# Patient Record
Sex: Female | Born: 1995 | Hispanic: Yes | Marital: Single | Smoking: Never smoker
Health system: Southern US, Community
[De-identification: ages and names within clinical notes are randomized; demographics above are authoritative.]

## PROBLEM LIST (undated history)

## (undated) DIAGNOSIS — E119 Type 2 diabetes mellitus without complications: Secondary | ICD-10-CM

## (undated) DIAGNOSIS — I2699 Other pulmonary embolism without acute cor pulmonale: Secondary | ICD-10-CM

## (undated) HISTORY — PX: APPENDECTOMY: SHX54

---
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
## 2017-01-29 ENCOUNTER — Encounter: Payer: Self-pay | Admitting: Emergency Medicine

## 2017-01-29 DIAGNOSIS — Z8249 Family history of ischemic heart disease and other diseases of the circulatory system: Secondary | ICD-10-CM | POA: Insufficient documentation

## 2017-01-29 DIAGNOSIS — R000 Tachycardia, unspecified: Secondary | ICD-10-CM | POA: Insufficient documentation

## 2017-01-29 DIAGNOSIS — Z713 Dietary counseling and surveillance: Secondary | ICD-10-CM | POA: Insufficient documentation

## 2017-01-29 DIAGNOSIS — I2699 Other pulmonary embolism without acute cor pulmonale: Principal | ICD-10-CM | POA: Insufficient documentation

## 2017-01-29 DIAGNOSIS — R0789 Other chest pain: Secondary | ICD-10-CM | POA: Insufficient documentation

## 2017-01-29 DIAGNOSIS — Z6841 Body Mass Index (BMI) 40.0 and over, adult: Secondary | ICD-10-CM | POA: Insufficient documentation

## 2017-01-29 DIAGNOSIS — E1165 Type 2 diabetes mellitus with hyperglycemia: Secondary | ICD-10-CM | POA: Insufficient documentation

## 2017-01-29 LAB — GLUCOSE, CAPILLARY: GLUCOSE-CAPILLARY: 463 mg/dL — AB (ref 65–99)

## 2017-01-29 NOTE — ED Triage Notes (Signed)
Pt presents to ED c/o mid chest pain since Saturday with increasing pain during inspiration. Pt denies hx of the same. Pt denies dizziness, abdominal pain, or shortness of breath. Pt alert and oriented, skin warm and dry.

## 2017-01-30 ENCOUNTER — Emergency Department: Payer: Self-pay

## 2017-01-30 ENCOUNTER — Observation Stay
Admission: EM | Admit: 2017-01-30 | Discharge: 2017-01-31 | Disposition: A | Discharge status: Home / Self Care | Payer: Self-pay | Attending: Internal Medicine | Admitting: Internal Medicine

## 2017-01-30 DIAGNOSIS — Z7901 Long term (current) use of anticoagulants: Secondary | ICD-10-CM

## 2017-01-30 DIAGNOSIS — I2699 Other pulmonary embolism without acute cor pulmonale: Secondary | ICD-10-CM

## 2017-01-30 DIAGNOSIS — R0789 Other chest pain: Secondary | ICD-10-CM | POA: Diagnosis present

## 2017-01-30 DIAGNOSIS — E119 Type 2 diabetes mellitus without complications: Secondary | ICD-10-CM

## 2017-01-30 DIAGNOSIS — I82403 Acute embolism and thrombosis of unspecified deep veins of lower extremity, bilateral: Secondary | ICD-10-CM

## 2017-01-30 DIAGNOSIS — R079 Chest pain, unspecified: Secondary | ICD-10-CM

## 2017-01-30 DIAGNOSIS — R739 Hyperglycemia, unspecified: Secondary | ICD-10-CM

## 2017-01-30 HISTORY — DX: Type 2 diabetes mellitus without complications: E11.9

## 2017-01-30 LAB — POCT PREGNANCY, URINE: PREG TEST UR: NEGATIVE

## 2017-01-30 LAB — BASIC METABOLIC PANEL
Anion gap: 10 (ref 5–15)
BUN: 6 mg/dL (ref 6–20)
CALCIUM: 9.4 mg/dL (ref 8.9–10.3)
CHLORIDE: 97 mmol/L — AB (ref 101–111)
CO2: 27 mmol/L (ref 22–32)
CREATININE: 0.51 mg/dL (ref 0.44–1.00)
GFR calc Af Amer: >60 mL/min (ref >60)
GFR calc non Af Amer: >60 mL/min (ref >60)
Glucose, Bld: 469 mg/dL — ABNORMAL HIGH (ref 65–99)
Potassium: 4.2 mmol/L (ref 3.5–5.1)
SODIUM: 134 mmol/L — AB (ref 135–145)

## 2017-01-30 LAB — CBC
HCT: 37.3 % (ref 35.0–47.0)
Hemoglobin: 12.2 g/dL (ref 12.0–16.0)
MCH: 24.9 pg — ABNORMAL LOW (ref 26.0–34.0)
MCHC: 32.8 g/dL (ref 32.0–36.0)
MCV: 75.8 fL — AB (ref 80.0–100.0)
PLATELETS: 298 10*3/uL (ref 150–440)
RBC: 4.92 MIL/uL (ref 3.80–5.20)
RDW: 13.8 % (ref 11.5–14.5)
WBC: 10.4 10*3/uL (ref 3.6–11.0)

## 2017-01-30 LAB — TSH: TSH: 3.054 u[IU]/mL (ref 0.350–4.500)

## 2017-01-30 LAB — GLUCOSE, CAPILLARY
GLUCOSE-CAPILLARY: 334 mg/dL — AB (ref 65–99)
Glucose-Capillary: 267 mg/dL — ABNORMAL HIGH (ref 65–99)
Glucose-Capillary: 300 mg/dL — ABNORMAL HIGH (ref 65–99)
Glucose-Capillary: 264 mg/dL — ABNORMAL HIGH (ref 65–99)
Glucose-Capillary: 210 mg/dL — ABNORMAL HIGH (ref 65–99)

## 2017-01-30 LAB — TROPONIN I

## 2017-01-30 LAB — FIBRIN DERIVATIVES D-DIMER (ARMC ONLY): Fibrin derivatives D-dimer (ARMC): 1260.76 — ABNORMAL HIGH (ref 0.00–499.00)

## 2017-01-30 MED ORDER — INSULIN GLARGINE 100 UNIT/ML ~~LOC~~ SOLN
25.0000 [IU] | Freq: Every day | SUBCUTANEOUS | Status: DC
  Administered 2017-01-30 – 2017-01-31 (×2): 25 [IU] via SUBCUTANEOUS
  Filled 2017-01-30 (×2): qty 0.25

## 2017-01-30 MED ORDER — ONDANSETRON HCL 4 MG PO TABS: 4.0000 mg | ORAL_TABLET | Freq: Four times a day (QID) | ORAL | Status: DC | PRN

## 2017-01-30 MED ORDER — INSULIN ASPART 100 UNIT/ML ~~LOC~~ SOLN
0.0000 [IU] | Freq: Every day | SUBCUTANEOUS | Status: DC
  Administered 2017-01-30: 2 [IU] via SUBCUTANEOUS
  Filled 2017-01-30: qty 2

## 2017-01-30 MED ORDER — ACETAMINOPHEN 650 MG RE SUPP: 650.0000 mg | Freq: Four times a day (QID) | RECTAL | Status: DC | PRN

## 2017-01-30 MED ORDER — ACETAMINOPHEN 325 MG PO TABS
650.0000 mg | ORAL_TABLET | Freq: Four times a day (QID) | ORAL | Status: DC | PRN
  Administered 2017-01-30: 650 mg via ORAL
  Filled 2017-01-30: qty 2

## 2017-01-30 MED ORDER — DOCUSATE SODIUM 100 MG PO CAPS
100.0000 mg | ORAL_CAPSULE | Freq: Two times a day (BID) | ORAL | Status: DC
  Administered 2017-01-30 – 2017-01-31 (×2): 100 mg via ORAL
  Filled 2017-01-30 (×2): qty 1

## 2017-01-30 MED ORDER — SODIUM CHLORIDE 0.9 % IV BOLUS (SEPSIS)
1000.0000 mL | Freq: Once | INTRAVENOUS | Status: AC
  Administered 2017-01-30: 1000 mL via INTRAVENOUS

## 2017-01-30 MED ORDER — IOPAMIDOL (ISOVUE-370) INJECTION 76%
75.0000 mL | Freq: Once | INTRAVENOUS | Status: AC | PRN
  Administered 2017-01-30: 75 mL via INTRAVENOUS

## 2017-01-30 MED ORDER — ENOXAPARIN SODIUM 150 MG/ML ~~LOC~~ SOLN
1.0000 mg/kg | Freq: Two times a day (BID) | SUBCUTANEOUS | Status: DC
  Administered 2017-01-30 – 2017-01-31 (×3): 125 mg via SUBCUTANEOUS
  Filled 2017-01-30 (×4): qty 0.82

## 2017-01-30 MED ORDER — ONDANSETRON HCL 4 MG/2ML IJ SOLN: 4.0000 mg | Freq: Four times a day (QID) | INTRAMUSCULAR | Status: DC | PRN

## 2017-01-30 MED ORDER — INSULIN ASPART 100 UNIT/ML ~~LOC~~ SOLN
0.0000 [IU] | Freq: Three times a day (TID) | SUBCUTANEOUS | Status: DC
  Administered 2017-01-30 – 2017-01-31 (×3): 8 [IU] via SUBCUTANEOUS
  Administered 2017-01-31: 5 [IU] via SUBCUTANEOUS
  Filled 2017-01-30 (×3): qty 8
  Filled 2017-01-30: qty 5

## 2017-01-30 MED ORDER — MORPHINE SULFATE (PF) 2 MG/ML IV SOLN: 2.0000 mg | INTRAVENOUS | Status: DC | PRN

## 2017-01-30 MED ORDER — INSULIN GLARGINE 100 UNIT/ML ~~LOC~~ SOLN
25.0000 [IU] | Freq: Every day | SUBCUTANEOUS | Status: DC
  Filled 2017-01-30: qty 0.25

## 2017-01-30 MED ORDER — LIVING WELL WITH DIABETES BOOK
Freq: Once | Status: AC
  Administered 2017-01-30: 12:00:00
  Filled 2017-01-30: qty 1

## 2017-01-30 MED ORDER — INSULIN STARTER KIT- SYRINGES (ENGLISH)
1.0000 | Freq: Once | Status: AC
  Administered 2017-01-30: 1
  Filled 2017-01-30 (×2): qty 1

## 2017-01-30 NOTE — Progress Notes (Signed)
ANTICOAGULATION CONSULT NOTE - Initial Consult  Pharmacy Consult for Lovenox treatment dose Indication: pulmonary embolus  No Known Allergies  Patient Measurements: Height: 5\' 5"  (165.1 cm) Weight: 270 lb (122.5 kg) IBW/kg (Calculated) : 57 Heparin Dosing Weight:    Vital Signs: Temp: 98.5 F (36.9 C) (06/05 0838) Temp Source: Oral (06/05 0838) BP: 139/75 (06/05 0838) Pulse Rate: 97 (06/05 0838)  Labs:  Recent Labs  01/29/17 2350  HGB 12.2  HCT 37.3  PLT 298  CREATININE 0.51  TROPONINI <0.03    Estimated Creatinine Clearance: 146.1 mL/min (by C-G formula based on SCr of 0.51 mg/dL).   Medical History: Past Medical History:  Diagnosis Date  . Diabetes mellitus without complication (HCC)     Medications:  Scheduled:  . docusate sodium  100 mg Oral BID  . enoxaparin (LOVENOX) injection  1 mg/kg Subcutaneous Q12H  . insulin aspart  0-15 Units Subcutaneous TID WC  . insulin aspart  0-5 Units Subcutaneous QHS  . insulin glargine  25 Units Subcutaneous QHS    Assessment: 21 yo with Chest pain, significantly elevated D-dimer. No definitive PE however a pulmonary infarction is present.  Goal of Therapy:  Monitor platelets by anticoagulation protocol: Yes  Monitor Serum Creatine per protocol   Plan:  Continue current order for Full dose Lovenox 1 mg/kg (125mg ) subcutaneously q12h.   Caedmon Louque A 01/30/2017,9:55 AM

## 2017-01-30 NOTE — H&P (Signed)
Kathleen Green is an 21 y.o. female.   Chief Complaint: Chest pain HPI: The patient with no prior past medical history presents to the emergency department complaining of right sided chest pain. The patient states that her chest hurts when she takes a deep breath or when she twists to the right. She states her pain began possibly 2 days ago in her right shoulder and migrated to her back and finally into her right rib cage today. She denies fevers but admits to chills. Laboratory evaluation significant for elevated d-dimer which prompted CTA. No definitive pulmonary embolism demonstrated however a pulmonary infarction is present indicating the presence of the former as well. Also, the patient's glucoses significantly elevated indicating new-onset diabetes which brought to the emergency department staff to call the hospitalist service for admission.  Past Medical History:  Diagnosis Date  . Diabetes mellitus without complication [facility])     Past Surgical History:  Procedure Laterality Date  . APPENDECTOMY      Family History  Problem Relation Age of Onset  . Diabetes Mellitus II Paternal Grandmother   . Diabetes Mellitus II Paternal Grandfather    Social History:  reports that she has never smoked. She has never used smokeless tobacco. She reports that she does not drink alcohol or use drugs.  Allergies: No Known Allergies  Prior to Admission medications   Not on File     Results for orders placed or performed during the hospital encounter of 01/30/17 (from the past 48 hour(s))  Basic metabolic panel     Status: Abnormal   Collection Time: 01/29/17 11:50 PM  Result Value Ref Range   Sodium 134 (L) 135 - 145 mmol/L   Potassium 4.2 3.5 - 5.1 mmol/L   Chloride 97 (L) 101 - 111 mmol/L   CO2 27 22 - 32 mmol/L   Glucose, Bld 469 (H) 65 - 99 mg/dL   BUN 6 6 - 20 mg/dL   Creatinine, Ser 0.51 0.44 - 1.00 mg/dL   Calcium 9.4 8.9 - 10.3 mg/dL   GFR calc non Af Amer >60 >60 mL/min    GFR calc Af Amer >60 >60 mL/min    Comment: (NOTE) The eGFR has been calculated using the CKD EPI equation. This calculation has not been validated in all clinical situations. eGFR's persistently <60 mL/min signify possible Chronic Kidney Disease.    Anion gap 10 5 - 15  CBC     Status: Abnormal   Collection Time: 01/29/17 11:50 PM  Result Value Ref Range   WBC 10.4 3.6 - 11.0 K/uL   RBC 4.92 3.80 - 5.20 MIL/uL   Hemoglobin 12.2 12.0 - 16.0 g/dL   HCT 37.3 35.0 - 47.0 %   MCV 75.8 (L) 80.0 - 100.0 fL   MCH 24.9 (L) 26.0 - 34.0 pg   MCHC 32.8 32.0 - 36.0 g/dL   RDW 13.8 11.5 - 14.5 %   Platelets 298 150 - 440 K/uL  Troponin I     Status: None   Collection Time: 01/29/17 11:50 PM  Result Value Ref Range   Troponin I <0.03 <0.03 ng/mL  Glucose, capillary     Status: Abnormal   Collection Time: 01/29/17 11:52 PM  Result Value Ref Range   Glucose-Capillary 463 (H) 65 - 99 mg/dL  Fibrin derivatives D-Dimer ([facility] only)     Status: Abnormal   Collection Time: 01/30/17  2:35 AM  Result Value Ref Range   Fibrin derivatives D-dimer ([facility]) 1,260.76 (H) 0.00 -  499.00    Comment: (NOTE) <> Exclusion of Venous Thromboembolism (VTE) - OUTPATIENT ONLY   (Emergency Department or [place])   0-499 ng/ml (FEU): With a low to intermediate pretest probability                      for VTE this test result excludes the diagnosis                      of VTE.   >499 ng/ml (FEU) : VTE not excluded; additional work up for VTE is                      required. <> Testing on Inpatients and Evaluation of Disseminated Intravascular   Coagulation ([facility]) Reference Range:   0-499 ng/ml (FEU)   Glucose, capillary     Status: Abnormal   Collection Time: 01/30/17  4:26 AM  Result Value Ref Range   Glucose-Capillary 334 (H) 65 - 99 mg/dL  Pregnancy, urine POC     Status: None   Collection Time: 01/30/17  4:27 AM  Result Value Ref Range   Preg Test, Ur NEGATIVE NEGATIVE    Comment:        THE SENSITIVITY OF  THIS METHODOLOGY IS >24 mIU/mL    Dg Chest 2 View  Result Date: 01/30/2017 CLINICAL DATA:  Mid chest pain for 2 days. EXAM: CHEST  2 VIEW COMPARISON:  03/10/2010 FINDINGS: Patchy opacity at the right costophrenic angle likely localizes to the right middle lobe on the lateral view. Overall lung volumes are low. Normal heart size and mediastinal contours. No pulmonary edema. No pleural fluid or pneumothorax. No acute osseous abnormalities. IMPRESSION: Patchy right lung base opacity likely localizing to the middle lobe on the lateral view. This may be atelectasis or pneumonia in the appropriate clinical setting. Electronically Signed   By: Jeb Levering M.D.   On: 01/30/2017 00:31   Ct Angio Chest Pe W Or Wo Contrast  Result Date: 01/30/2017 CLINICAL DATA:  Chest pain. EXAM: CT ANGIOGRAPHY CHEST WITH CONTRAST TECHNIQUE: Multidetector CT imaging of the chest was performed using the standard protocol during bolus administration of intravenous contrast. Multiplanar CT image reconstructions and MIPs were obtained to evaluate the vascular anatomy. CONTRAST:  75 cc Isovue 370 IV COMPARISON:  Chest radiograph earlier this day. FINDINGS: Cardiovascular: There are no filling defects in the central pulmonary arteries to suggest pulmonary embolus, however the subsegmental branches are not well opacified due to soft tissue attenuation from habitus. Thoracic aorta is normal in caliber. The heart is normal in size. No evidence of right heart strain. Mediastinum/Nodes: Prominent right hilar node measures 11 mm. No mediastinal or left hilar adenopathy. No axillary adenopathy. The esoph There are no acute or suspicious osseous abnormalities. agus is decompressed. Visualized thyroid gland is normal. Lungs/Pleura: Peripheral wedge-shaped consolidation in the right lower lobe with surrounding ground-glass opacity. Left lung is clear. No pleural fluid. Upper Abdomen: Liver appears prominent size, only partially included.  Musculoskeletal: There are no acute or suspicious osseous abnormalities. Review of the MIP images confirms the above findings. IMPRESSION: Peripheral wedge-shaped consolidation in the right lower lobe is highly suspicious for pulmonary infarct. There is no central pulmonary arterial filling defect, however distal branches are not well opacified suspect right lower lobe pulmonary embolus is not well visualized on the current exam. Overall findings are highly suggestive of acute pulmonary embolus. These results were called by telephone at the time  of interpretation on 01/30/2017 at 5:28 am to Dr. Charlesetta Ivory , who verbally acknowledged these results. Electronically Signed   By: Jeb Levering M.D.   On: 01/30/2017 05:29    Review of Systems  Constitutional: Negative for chills and fever.  HENT: Negative for sore throat and tinnitus.   Eyes: Negative for blurred vision and redness.  Respiratory: Negative for cough and shortness of breath.   Cardiovascular: Positive for chest pain. Negative for palpitations, orthopnea and PND.  Gastrointestinal: Negative for abdominal pain, diarrhea, nausea and vomiting.  Genitourinary: Negative for dysuria, frequency and urgency.  Musculoskeletal: Negative for joint pain and myalgias.  Skin: Negative for rash.       No lesions  Neurological: Negative for speech change, focal weakness and weakness.  Endo/Heme/Allergies: Does not bruise/bleed easily.       No temperature intolerance  Psychiatric/Behavioral: Negative for depression and suicidal ideas.    Blood pressure (!) 134/57, pulse 90, temperature 99.4 F (37.4 C), temperature source Oral, resp. rate 20, height 5' 5"  (1.651 m), weight 122.5 kg (270 lb), last menstrual period 01/15/2017, SpO2 97 %. Physical Exam  Vitals reviewed. Constitutional: She is oriented to person, place, and time. She appears well-developed and well-nourished. No distress.  HENT:  Head: Normocephalic and atraumatic.   Mouth/Throat: Oropharynx is clear and moist.  Eyes: Conjunctivae and EOM are normal. Pupils are equal, round, and reactive to light. No scleral icterus.  Neck: Normal range of motion. Neck supple. No tracheal deviation present. No thyromegaly present.  Cardiovascular: Normal rate, regular rhythm and normal heart sounds.  Exam reveals no gallop and no friction rub.   No murmur heard. Respiratory: Effort normal and breath sounds normal.  GI: Soft. Bowel sounds are normal. She exhibits no distension. There is no tenderness.  Genitourinary:  Genitourinary Comments: Deferred  Musculoskeletal: Normal range of motion. She exhibits no edema.  Lymphadenopathy:    She has no cervical adenopathy.  Neurological: She is alert and oriented to person, place, and time. No cranial nerve deficit. She exhibits normal muscle tone.  Skin: Skin is warm and dry. No rash noted. No erythema.  Psychiatric: She has a normal mood and affect. Her behavior is normal. Judgment and thought content normal.     Assessment/Plan This is a 21 year old female admitted for pulmonary and infarction. 1. Pulmonary embolism: With infarction; no hypoxia. Started on therapeutic Lovenox. We will arrange for anticoagulation therapy the patient can afford. No right heart strain demonstrated on CT scan. 2. Diabetes mellitus type 2: New onset; start patient on sliding scale insulin. Start weight-based basal insulin as well. 3. Morbid obesity: BMI 45; encourage healthy diet and exercise 4. DVT prophylaxis: Full dose anticoagulation 5. GI prophylaxis: None The patient is a full code. Time spent on admission was inpatient care approximately 45 minutes  Harrie Foreman, MD 01/30/2017, 6:09 AM

## 2017-01-30 NOTE — ED Provider Notes (Signed)
[facility] Emergency Department Provider Note   ____________________________________________   First MD Initiated Contact with Patient 01/30/17 0230     (approximate)  I have reviewed the triage vital signs and the nursing notes.   HISTORY  Chief Complaint Chest Pain    HPI Kathleen Green is a 21 y.o. female who comes into the hospital today with some pain in her right chest. She reports that she woke up Saturday morning with pain in her right shoulder and in the middle of her chest when she took a deep breath. She reports that it moved to her back on Sunday and then today the entire right side of her chest hurts whenever she takes a deep breath. The patient denies any plane trips or recent car trips. She reports that she felt a little warm last night but didn't check her temperature. She denies any cough dizziness or lightheadedness but she has had some shortness of breath. The patient rates her pain a 78 out of 10 in intensity but reports this worse when she takes a deep breath. She took some Tylenol for pain but it didn't help. The patient is here today for evaluation of these symptoms.   Past Medical History:  Diagnosis Date  . Diabetes mellitus without complication [facility])     Patient Active Problem List   Diagnosis Date Noted  . Atypical chest pain 01/30/2017    Past Surgical History:  Procedure Laterality Date  . APPENDECTOMY      Prior to Admission medications   Not on File    Allergies Patient has no known allergies.  Family History  Problem Relation Age of Onset  . Diabetes Mellitus II Paternal Grandmother   . Diabetes Mellitus II Paternal Grandfather     Social History Social History  Substance Use Topics  . Smoking status: Never Smoker  . Smokeless tobacco: Never Used  . Alcohol use No    Review of Systems  Constitutional: No fever/chills Eyes: No visual changes. ENT: No sore throat. Cardiovascular:  chest  pain. Respiratory: shortness of breath. Gastrointestinal: No abdominal pain.  No nausea, no vomiting.  No diarrhea.  No constipation. Genitourinary: Negative for dysuria. Musculoskeletal: Negative for back pain. Skin: Negative for rash. Neurological: Negative for headaches, focal weakness or numbness.   ____________________________________________   PHYSICAL EXAM:  VITAL SIGNS: ED Triage Vitals  Enc Vitals Group     BP 01/29/17 2354 (!) 153/99     Pulse Rate 01/29/17 2354 (!) 118     Resp 01/29/17 2354 20     Temp 01/29/17 2354 99.4 F (37.4 C)     Temp Source 01/29/17 2354 Oral     SpO2 01/29/17 2354 98 %     Weight 01/29/17 2349 270 lb (122.5 kg)     Height 01/29/17 2349 5\' 5"  (1.651 m)     Head Circumference --      Peak Flow --      Pain Score 01/29/17 2349 8     Pain Loc --      Pain Edu? --      Excl. in GC? --     Constitutional: Alert and oriented. Well appearing and in Moderate distress. Eyes: Conjunctivae are normal. PERRL. EOMI. Head: Atraumatic. Nose: No congestion/rhinnorhea. Mouth/Throat: Mucous membranes are moist.  Oropharynx non-erythematous. Cardiovascular: Normal rate, regular rhythm. Grossly normal heart sounds.  Good peripheral circulation. Respiratory: Normal respiratory effort.  No retractions. Lungs CTAB. Gastrointestinal: Soft and nontender. No distention. Positive  bowel sounds Musculoskeletal: No lower extremity tenderness nor edema.   Neurologic:  Normal speech and language.  Skin:  Skin is warm, dry and intact.  Psychiatric: Mood and affect are normal.   ____________________________________________   LABS (all labs ordered are listed, but only abnormal results are displayed)  Labs Reviewed  BASIC METABOLIC PANEL - Abnormal; Notable for the following:       Result Value   Sodium 134 (*)    Chloride 97 (*)    Glucose, Bld 469 (*)    All other components within normal limits  CBC - Abnormal; Notable for the following:    MCV 75.8  (*)    MCH 24.9 (*)    All other components within normal limits  GLUCOSE, CAPILLARY - Abnormal; Notable for the following:    Glucose-Capillary 463 (*)    All other components within normal limits  FIBRIN DERIVATIVES D-DIMER ([facility] ONLY) - Abnormal; Notable for the following:    Fibrin derivatives D-dimer ([facility]) 1,260.76 (*)    All other components within normal limits  GLUCOSE, CAPILLARY - Abnormal; Notable for the following:    Glucose-Capillary 334 (*)    All other components within normal limits  TROPONIN I  POC URINE PREG, ED  POCT PREGNANCY, URINE   ____________________________________________  EKG  ED ECG REPORT I, Rebecka Apley, the attending physician, personally viewed and interpreted this ECG.   Date: 01/29/2017  EKG Time: 2346  Rate: 118  Rhythm: sinus tachycardia  Axis: normal  Intervals:none  ST&T Change: none  ____________________________________________  RADIOLOGY  Dg Chest 2 View  Result Date: 01/30/2017 CLINICAL DATA:  Mid chest pain for 2 days. EXAM: CHEST  2 VIEW COMPARISON:  03/10/2010 FINDINGS: Patchy opacity at the right costophrenic angle likely localizes to the right middle lobe on the lateral view. Overall lung volumes are low. Normal heart size and mediastinal contours. No pulmonary edema. No pleural fluid or pneumothorax. No acute osseous abnormalities. IMPRESSION: Patchy right lung base opacity likely localizing to the middle lobe on the lateral view. This may be atelectasis or pneumonia in the appropriate clinical setting. Electronically Signed   By: Rubye Oaks M.D.   On: 01/30/2017 00:31   Ct Angio Chest Pe W Or Wo Contrast  Result Date: 01/30/2017 CLINICAL DATA:  Chest pain. EXAM: CT ANGIOGRAPHY CHEST WITH CONTRAST TECHNIQUE: Multidetector CT imaging of the chest was performed using the standard protocol during bolus administration of intravenous contrast. Multiplanar CT image reconstructions and MIPs were obtained to evaluate the  vascular anatomy. CONTRAST:  75 cc Isovue 370 IV COMPARISON:  Chest radiograph earlier this day. FINDINGS: Cardiovascular: There are no filling defects in the central pulmonary arteries to suggest pulmonary embolus, however the subsegmental branches are not well opacified due to soft tissue attenuation from habitus. Thoracic aorta is normal in caliber. The heart is normal in size. No evidence of right heart strain. Mediastinum/Nodes: Prominent right hilar node measures 11 mm. No mediastinal or left hilar adenopathy. No axillary adenopathy. The esoph There are no acute or suspicious osseous abnormalities. agus is decompressed. Visualized thyroid gland is normal. Lungs/Pleura: Peripheral wedge-shaped consolidation in the right lower lobe with surrounding ground-glass opacity. Left lung is clear. No pleural fluid. Upper Abdomen: Liver appears prominent size, only partially included. Musculoskeletal: There are no acute or suspicious osseous abnormalities. Review of the MIP images confirms the above findings. IMPRESSION: Peripheral wedge-shaped consolidation in the right lower lobe is highly suspicious for pulmonary infarct. There is no central  pulmonary arterial filling defect, however distal branches are not well opacified suspect right lower lobe pulmonary embolus is not well visualized on the current exam. Overall findings are highly suggestive of acute pulmonary embolus. These results were called by telephone at the time of interpretation on 01/30/2017 at 5:28 am to Dr. Lucrezia EuropeALLISON Brennden Masten , who verbally acknowledged these results. Electronically Signed   By: Rubye OaksMelanie  Ehinger M.D.   On: 01/30/2017 05:29    ____________________________________________   PROCEDURES  Procedure(s) performed: None  Procedures  Critical Care performed: No  ____________________________________________   INITIAL IMPRESSION / ASSESSMENT AND PLAN / ED COURSE  Pertinent labs & imaging results that were available during my care of  the patient were reviewed by me and considered in my medical decision making (see chart for details).  This is a 21 year old female who comes into the hospital today with some chest pain. The patient's pain was pleuritic in nature so I did check a d-dimer. The patient's d-dimer was elevated so I sent her for a CT scan of her chest. The CT scan shows a pulmonary infarct unlikely pulmonary embolus. I did give the patient 2 L of normal saline but once I found that she did have the pulmonary infarct I decided to admit the patient to the hospitalist service. She did have some mild persistent tachycardia and may not be able to follow up easily. I will give the patient a dose of Lovenox and she'll be reassessed.  Clinical Course as of Jan 30 614  Tue Jan 30, 2017  0151 Patchy right lung base opacity likely localizing to the middle lobe on the lateral view. This may be atelectasis or pneumonia in the appropriate clinical setting.   DG Chest 2 View [AW]    Clinical Course User Index [AW] Rebecka ApleyWebster, Jaxsin Bottomley P, MD     ____________________________________________   FINAL CLINICAL IMPRESSION(S) / ED DIAGNOSES  Final diagnoses:  Chest pain  Pulmonary infarct [facility](HCC)  Other acute pulmonary embolism without acute cor pulmonale (HCC)  Hyperglycemia      NEW MEDICATIONS STARTED DURING THIS VISIT:  New Prescriptions   No medications on file     Note:  This document was prepared using Dragon voice recognition software and may include unintentional dictation errors.    Rebecka ApleyWebster, Alveena Taira P, MD 01/30/17 [phone]

## 2017-01-30 NOTE — Care Management Note (Signed)
Case Management Note  Patient Details  Name: Kasandra Knudsenndrea Yanez Gutierrez MRN: [id number] Date of Birth: Nov 08, 1995  Subjective/Objective:                 Admitted with chest pain and ruled in for pulmonary embolus.  Also diagnosed with new onset diabetes.  Has not see a doctor in 5-6 years.  Employed without insurance.  Action/Plan:   Provide glucose monitor.  Applications for [facility] given.  Discussed anticoagulation regimen and need for close follow up either at [facility] or [facility].   Expected Discharge Date:                  Expected Discharge Plan:     In-House Referral:     Discharge planning Services     Post Acute Care Choice:    Choice offered to:     DME Arranged:    DME Agency:     HH Arranged:    HH Agency:     Status of Service:     If discussed at [employer] Meetings, dates discussed:    Additional Comments:  Eber HongGreene, Luther Newhouse R, RN 01/30/2017, 11:46 AM

## 2017-01-30 NOTE — Consult Note (Signed)
[facility]  Date of admission:  01/30/2017  Inpatient day:  01/30/2017  Consulting physician: Dr. Demetrios Loll   Reason for Consultation:  PE  Chief Complaint: Kathleen Green is a 21 y.o. female with no significant past medical history who was admitted through the emergency room with a pulmonary embolism.  HPI:  The patient denies any past medical problems.  She has not been to the [facility] in several years.  She was in her usual state of good health until 01/27/2017 when she developed right shoulder pain which then radiated into her anterior chest. She described pleuritic chest pain. She denied any lower extremity edema. She noted a low grade temperature (99) with a brief chill.  She presented to the emergency room. D dimers were elevated. Chest x-ray revealed patchy right lung base opacity likely localizing to the middle lobe. Chest CT angiogram revealed peripheral wedge shaped consolidation in the right lower lobe highly suspicious for pulmonary infarct. There was no central pulmonary artery filling defect, however distal branches were not well opacified with suspect right lower lobe pulmonary embolism. Findings were highly suggestive of acute pulmonary embolism.  The patient was started on therapeutic Lovenox.  The patient notes a family history of thrombosis in her paternal grandfather. She states that his "blood clots easily". She notes that he had a CVA in his 45s.   She denies any history of tobacco use or birth control.  She denies any immobility or dehydration. She states that she goes to the gym 1-2 times a week and works out with legs curls, treadmill and bicycle.   Past Medical History:  Diagnosis Date  . Diabetes mellitus without complication [facility])     Past Surgical History:  Procedure Laterality Date  . APPENDECTOMY      Family History  Problem Relation Age of Onset  . Diabetes Mellitus II Paternal Grandmother   . Diabetes  Mellitus II Paternal Grandfather   . Clotting disorder Maternal Grandfather     Social History:  reports that she has never smoked. She has never used smokeless tobacco. She reports that she does not drink alcohol or use drugs. She has a full-time job at the [employer]. She goes to [employer] and is working on a degree in early childhood development. She graduates next year. She lives her grandparents and siblings.  Allergies: No Known Allergies  No prescriptions prior to admission.    Review of Systems: GENERAL:  Feels "ok".  Low grade temperature and brief chill prior to admission.  No weight loss. PERFORMANCE STATUS (ECOG):  1 HEENT:  No visual changes, runny nose, sore throat, mouth sores or tenderness. Lungs: No shortness of breath or cough.  No hemoptysis. Cardiac:  Pleuritic chest pain.  No palpitations, orthopnea, or PND. GI:  No nausea, vomiting, diarrhea, constipation, melena or hematochezia. GU:  Chronic urinary frequency.  No urgency, dysuria, or hematuria. Musculoskeletal:  No back pain.  No joint pain.  No muscle tenderness. Extremities:  No pain or swelling. Skin:  No rashes or skin changes. Neuro:  Headaches.  No numbness or weakness, balance or coordination issues. Endocrine:  No diabetes, thyroid issues, hot flashes or night sweats. Psych:  No mood changes, depression or anxiety. Pain:  No focal pain. Review of systems:  All other systems reviewed and found to be negative.  Physical Exam:  Blood pressure (!) 144/92, pulse 100, temperature 98.5 F (36.9 C), temperature source Oral, resp. rate 16, height 5'  5" (1.651 m), weight 270 lb (122.5 kg), last menstrual period 01/15/2017, SpO2 99 %.  GENERAL:  Well developed, well nourished, heavyset young woman sitting comfortably on the medical unit in no acute distress. MENTAL STATUS:  Alert and oriented to person, place and time. HEAD:  Long brown hair.  Normocephalic, atraumatic, face symmetric,  no Cushingoid features. EYES:  Brown eyes.  Pupils equal round and reactive to light and accomodation.  No conjunctivitis or scleral icterus. ENT:  Oropharynx clear without lesion.  Tongue normal. Mucous membranes moist.  RESPIRATORY:  Clear to auscultation without rales, wheezes or rhonchi. CARDIOVASCULAR:  Regular rate and rhythm without murmur, rub or gallop. ABDOMEN:  Soft, non-tender, with active bowel sounds, and no appreciable hepatosplenomegaly.  No masses. SKIN:  No rashes, ulcers or lesions. EXTREMITIES: No edema, no skin discoloration or tenderness.  No palpable cords. LYMPH NODES: No palpable cervical, supraclavicular, axillary or inguinal adenopathy  NEUROLOGICAL: Unremarkable. PSYCH:  Appropriate.   Results for orders placed or performed during the hospital encounter of 01/30/17 (from the past 48 hour(s))  Basic metabolic panel     Status: Abnormal   Collection Time: 01/29/17 11:50 PM  Result Value Ref Range   Sodium 134 (L) 135 - 145 mmol/L   Potassium 4.2 3.5 - 5.1 mmol/L   Chloride 97 (L) 101 - 111 mmol/L   CO2 27 22 - 32 mmol/L   Glucose, Bld 469 (H) 65 - 99 mg/dL   BUN 6 6 - 20 mg/dL   Creatinine, Ser 0.51 0.44 - 1.00 mg/dL   Calcium 9.4 8.9 - 10.3 mg/dL   GFR calc non Af Amer >60 >60 mL/min   GFR calc Af Amer >60 >60 mL/min    Comment: (NOTE) The eGFR has been calculated using the CKD EPI equation. This calculation has not been validated in all clinical situations. eGFR's persistently <60 mL/min signify possible Chronic Kidney Disease.    Anion gap 10 5 - 15  CBC     Status: Abnormal   Collection Time: 01/29/17 11:50 PM  Result Value Ref Range   WBC 10.4 3.6 - 11.0 K/uL   RBC 4.92 3.80 - 5.20 MIL/uL   Hemoglobin 12.2 12.0 - 16.0 g/dL   HCT 37.3 35.0 - 47.0 %   MCV 75.8 (L) 80.0 - 100.0 fL   MCH 24.9 (L) 26.0 - 34.0 pg   MCHC 32.8 32.0 - 36.0 g/dL   RDW 13.8 11.5 - 14.5 %   Platelets 298 150 - 440 K/uL  Troponin I     Status: None   Collection Time:  01/29/17 11:50 PM  Result Value Ref Range   Troponin I <0.03 <0.03 ng/mL  TSH     Status: None   Collection Time: 01/29/17 11:50 PM  Result Value Ref Range   TSH 3.054 0.350 - 4.500 uIU/mL    Comment: Performed by a 3rd Generation assay with a functional sensitivity of <=0.01 uIU/mL.  Glucose, capillary     Status: Abnormal   Collection Time: 01/29/17 11:52 PM  Result Value Ref Range   Glucose-Capillary 463 (H) 65 - 99 mg/dL  Fibrin derivatives D-Dimer ([facility] only)     Status: Abnormal   Collection Time: 01/30/17  2:35 AM  Result Value Ref Range   Fibrin derivatives D-dimer ([facility]) 1,260.76 (H) 0.00 - 499.00    Comment: (NOTE) <> Exclusion of Venous Thromboembolism (VTE) - OUTPATIENT ONLY   (Emergency Department or [place])   0-499 ng/ml (FEU): With a low to intermediate  pretest probability                      for VTE this test result excludes the diagnosis                      of VTE.   >499 ng/ml (FEU) : VTE not excluded; additional work up for VTE is                      required. <> Testing on Inpatients and Evaluation of Disseminated Intravascular   Coagulation (DIC) Reference Range:   0-499 ng/ml (FEU)   Glucose, capillary     Status: Abnormal   Collection Time: 01/30/17  4:26 AM  Result Value Ref Range   Glucose-Capillary 334 (H) 65 - 99 mg/dL  Pregnancy, urine POC     Status: None   Collection Time: 01/30/17  4:27 AM  Result Value Ref Range   Preg Test, Ur NEGATIVE NEGATIVE    Comment:        THE SENSITIVITY OF THIS METHODOLOGY IS >24 mIU/mL   Glucose, capillary     Status: Abnormal   Collection Time: 01/30/17  8:41 AM  Result Value Ref Range   Glucose-Capillary 267 (H) 65 - 99 mg/dL  Glucose, capillary     Status: Abnormal   Collection Time: 01/30/17 12:09 PM  Result Value Ref Range   Glucose-Capillary 300 (H) 65 - 99 mg/dL  Glucose, capillary     Status: Abnormal   Collection Time: 01/30/17  4:38 PM  Result Value Ref Range   Glucose-Capillary 264 (H) 65 - 99  mg/dL   Dg Chest 2 View  Result Date: 01/30/2017 CLINICAL DATA:  Mid chest pain for 2 days. EXAM: CHEST  2 VIEW COMPARISON:  03/10/2010 FINDINGS: Patchy opacity at the right costophrenic angle likely localizes to the right middle lobe on the lateral view. Overall lung volumes are low. Normal heart size and mediastinal contours. No pulmonary edema. No pleural fluid or pneumothorax. No acute osseous abnormalities. IMPRESSION: Patchy right lung base opacity likely localizing to the middle lobe on the lateral view. This may be atelectasis or pneumonia in the appropriate clinical setting. Electronically Signed   By: Jeb Levering M.D.   On: 01/30/2017 00:31   Ct Angio Chest Pe W Or Wo Contrast  Result Date: 01/30/2017 CLINICAL DATA:  Chest pain. EXAM: CT ANGIOGRAPHY CHEST WITH CONTRAST TECHNIQUE: Multidetector CT imaging of the chest was performed using the standard protocol during bolus administration of intravenous contrast. Multiplanar CT image reconstructions and MIPs were obtained to evaluate the vascular anatomy. CONTRAST:  75 cc Isovue 370 IV COMPARISON:  Chest radiograph earlier this day. FINDINGS: Cardiovascular: There are no filling defects in the central pulmonary arteries to suggest pulmonary embolus, however the subsegmental branches are not well opacified due to soft tissue attenuation from habitus. Thoracic aorta is normal in caliber. The heart is normal in size. No evidence of right heart strain. Mediastinum/Nodes: Prominent right hilar node measures 11 mm. No mediastinal or left hilar adenopathy. No axillary adenopathy. The esoph There are no acute or suspicious osseous abnormalities. agus is decompressed. Visualized thyroid gland is normal. Lungs/Pleura: Peripheral wedge-shaped consolidation in the right lower lobe with surrounding ground-glass opacity. Left lung is clear. No pleural fluid. Upper Abdomen: Liver appears prominent size, only partially included. Musculoskeletal: There are no acute  or suspicious osseous abnormalities. Review of the MIP images confirms the above findings. IMPRESSION:  Peripheral wedge-shaped consolidation in the right lower lobe is highly suspicious for pulmonary infarct. There is no central pulmonary arterial filling defect, however distal branches are not well opacified suspect right lower lobe pulmonary embolus is not well visualized on the current exam. Overall findings are highly suggestive of acute pulmonary embolus. These results were called by telephone at the time of interpretation on 01/30/2017 at 5:28 am to Dr. Charlesetta Ivory , who verbally acknowledged these results. Electronically Signed   By: Jeb Levering M.D.   On: 01/30/2017 05:29    Assessment:  The patient is a 21 y.o.  woman with right lower lobe pulmonary embolism without apparent risk factors.  She is over weight.  She denies any tobacco use, birth control, immobility or dehydration.  She is currently on Lovenox.   Plan:   1.  Discuss risk factors for thrombosis and planned treatment.  Discuss at minimum 6 months of anticoagulation based on work-up. 2.  Lab:  LFTs, Factor V Leiden, prothrombin gene mutation, lupus anticoagulant panel, anticardiolipin antibodies, beta-2 glycoprotein antibodies. 3.  Plan to check protein C, protein S, and AT III activity and antigen in 3 months (after acute clot). 4.  Bilateral lower extremity duplex. 5.  Continue Lovenox with plan to convert to long term anticoagulant. 6.  Anticipate follow-up in the hematology clinic after discharge.   Thank you for allowing me to participate in Kathleen Green 's care.  I will follow her closely with you while hospitalized and after discharge in the outpatient department.   Lequita Asal, MD  01/30/2017, 8:06 PM

## 2017-01-30 NOTE — Progress Notes (Signed)
A & O. Up  In the room. Room air. NSR. Pt reports no pain. Pt tolerating diet well. FS are stable. Pt given the diabetes education book. Pt has no further concerns at this time.

## 2017-01-30 NOTE — Progress Notes (Signed)
Spoke with patient over the phone (Diabetes Coordinator not on [place] today) about new diabetes diagnosis. Patient states that she speaks [place] and [place] and she is able to communicate very well in [place] and states that she prefers any written literature to be in [place].  Patient reports that she has a history of Prediabetes and she use to take Metformin and monitor glucose in the past. Patient reports that she has not taken Metformin or checked her glucose in over 4 years. Patient states that her glucose was usually in the low 100's mg/dl when she last checked her glucose several years ago.  Explained what an A1C is and informed patient that her A1C results are still in process at this time.  Discussed basic pathophysiology of DM Type 2, basic home care, importance of checking CBGs and maintaining good CBG control to prevent long-term and short-term complications. Reviewed glucose and A1C goals.  Discussed impact of nutrition, exercise, stress, sickness, and medications on diabetes control. Informed patient that the Living Well with diabetes booklet would be ordered and encouraged patient to read through entire book. Inquired about willingness to take insulin as an outpatient and patient states that she would prefer to use oral DM medications as an outpatient and make lifestyle modifications with diet and exercise then follow up with a doctor. Informed patient, MD will likely wait until A1C results are processed in order to decide if she will need insulin as an outpatient. Informed patient that she would be ordered an insulin starter kit (vial/syringe) and bedside nursing will be educating her on insulin in case she needs to be discharged on insulin. Patient is agreeable to be educated on insulin but states she would prefer to use oral DM medications as an outpatient.  Patient currently does not have any insurance and will need to establish care with PCP for follow up. Patient reports that she went to  the [facility] in the past but it has been about 4 years since she was last seen there. Placed consult for CM for assistance with follow up and with medication assistance. Patient reports that she does not currently follow a carb modified diet. Briefly discussed carb modified diet and informed patient a consult for RD would be ordered for more education on diet.  Informed patient that RNs will be asking her to self-administer insulin to ensure proper technique and ability to administer self insulin shots in case she needs insulin as an outpatient.   Patient verbalized understanding of information discussed and she states that she has no further questions at this time related to diabetes.   RNs to provide ongoing basic DM education at bedside with this patient and engage patient to actively check blood glucose and administer insulin injections.   MD-at time of discharge patient will need Rx for: glucometer, testing supplies, and DM medications  Thanks, Barnie Alderman, RN, MSN, CDE Diabetes Coordinator Inpatient Diabetes Program [phone] (Team Pager from 8am to 5pm)

## 2017-01-30 NOTE — Progress Notes (Signed)
Inpatient Diabetes Program Recommendations  AACE/ADA: New Consensus Statement on Inpatient Glycemic Control (2015)  Target Ranges:  Prepandial:   less than 140 mg/dL      Peak postprandial:   less than 180 mg/dL (1-2 hours)      Critically ill patients:  140 - 180 mg/dL   Results for Kathleen Green, Kathleen Green (MRN [id number]) as of 01/30/2017 09:21  Ref. Range 01/29/2017 23:52 01/30/2017 04:26 01/30/2017 08:41  Glucose-Capillary Latest Ref Range: 65 - 99 mg/dL 409463 (H) 811334 (H) 914267 (H)    Admit with: PE + New Onset Diabetes  Current Insulin Orders: Lantus 25 units QHS      Novolog Moderate Correction Scale/ SSI (0-15 units) TID AC + HS      MD- Note patient with new diagnosis of DM.  Current Hemoglobin A1c pending.  Note that Lantus not scheduled to start until tonight.  Please consider changing time of Lantus to start this AM rather than waiting until tonight.  Also, per records, patient is currently uninsured.  If will need insulin at discharge, will likely need more affordable insulin at time of d/c like 70/30 Insulin [facility]ial.  Will ask RNs to begin basic Diabetes survival skills education with patient today.     --Will follow patient during hospitalization--  Ambrose FinlandJeannine Johnston Penni Penado RN, MSN, CDE Diabetes Coordinator Inpatient Glycemic Control Team Team Pager: [phone] (8a-5p)

## 2017-01-30 NOTE — Progress Notes (Signed)
This is a 21 year old female admitted for pulmonary and infarction. The patient complains of chest pain on the right side while deep breath.  Medicine is stable, physical examination is unremarkable except morbid obesity.   A/P 1. Pulmonary embolism: With infarction; no hypoxia.  Started on therapeutic Lovenox. We will arrange for anticoagulation therapy the patient can afford. No right heart strain demonstrated on CT scan. Hematology consult for further workup. Her grandfather had a blood clot. 2. Diabetes mellitus type 2: New onset with hyperglycemia She is on sliding scale insulin. Start lantus.  3. Morbid obesity: BMI 45; encourage healthy diet and exercise  Discussed with the patient and her mother, discussed with RN.  Time spent about 32 minutes.

## 2017-01-31 ENCOUNTER — Observation Stay: Payer: Self-pay

## 2017-01-31 LAB — HEPATIC FUNCTION PANEL
ALT: 97 U/L — ABNORMAL HIGH (ref 14–54)
AST: 78 U/L — ABNORMAL HIGH (ref 15–41)
Albumin: 3.2 g/dL — ABNORMAL LOW (ref 3.5–5.0)
Alkaline Phosphatase: 59 U/L (ref 38–126)
Bilirubin, Direct: 0.1 mg/dL (ref 0.1–0.5)
Indirect Bilirubin: 0.7 mg/dL (ref 0.3–0.9)
Total Bilirubin: 0.8 mg/dL (ref 0.3–1.2)
Total Protein: 7 g/dL (ref 6.5–8.1)

## 2017-01-31 LAB — GLUCOSE, CAPILLARY
Glucose-Capillary: 213 mg/dL — ABNORMAL HIGH (ref 65–99)
Glucose-Capillary: 257 mg/dL — ABNORMAL HIGH (ref 65–99)

## 2017-01-31 LAB — PROTIME-INR
INR: 1.1
Prothrombin Time: 14.2 seconds (ref 11.4–15.2)

## 2017-01-31 LAB — HEMOGLOBIN A1C
Hgb A1c MFr Bld: 12.2 % — ABNORMAL HIGH (ref 4.8–5.6)
Mean Plasma Glucose: 303 mg/dL

## 2017-01-31 MED ORDER — METFORMIN HCL 500 MG PO TABS: 1000.0000 mg | ORAL_TABLET | Freq: Two times a day (BID) | ORAL | Status: DC

## 2017-01-31 MED ORDER — APIXABAN 5 MG PO TABS: ORAL_TABLET | ORAL | 0 refills | Status: AC

## 2017-01-31 MED ORDER — POTASSIUM CHLORIDE CRYS ER 20 MEQ PO TBCR: 40.0000 meq | EXTENDED_RELEASE_TABLET | Freq: Once | ORAL | Status: DC

## 2017-01-31 MED ORDER — APIXABAN 5 MG PO TABS: 10.0000 mg | ORAL_TABLET | Freq: Two times a day (BID) | ORAL | Status: DC

## 2017-01-31 MED ORDER — METFORMIN HCL 1000 MG PO TABS: 1000.0000 mg | ORAL_TABLET | Freq: Two times a day (BID) | ORAL | 0 refills | Status: AC

## 2017-01-31 MED ORDER — APIXABAN 5 MG PO TABS: 5.0000 mg | ORAL_TABLET | Freq: Two times a day (BID) | ORAL | Status: DC

## 2017-01-31 NOTE — Plan of Care (Signed)
Problem: Food- and Nutrition-Related Knowledge Deficit (NB-1.1) Goal: Nutrition education Formal process to instruct or train a patient/client in a skill or to impart knowledge to help patients/clients voluntarily manage or modify food choices and eating behavior to maintain or improve health. Outcome: Adequate for Discharge  RD consulted for nutrition education regarding diabetes.   Lab Results  Component Value Date   HGBA1C 12.2 (H) 01/29/2017    RD provided "Carbohydrate Counting for People with Diabetes" handout from the Academy of Nutrition and Dietetics. Discussed different food groups and their effects on blood sugar, emphasizing carbohydrate-containing foods. Provided list of carbohydrates and recommended serving sizes of common foods.  Discussed importance of controlled and consistent carbohydrate intake throughout the day. Provided examples of ways to balance meals/snacks and encouraged intake of high-fiber, whole grain complex carbohydrates. Teach back method used.  Expect fair compliance.  Body mass index is 46.34 kg/m. Pt meets criteria for obese class III based on current BMI.  Current diet order is carb mod, patient is consuming approximately 75-100% of meals at this time. Labs and medications reviewed. No further nutrition interventions warranted at this time. RD contact information provided. If additional nutrition issues arise, please re-consult RD.  Kathleen AnoWilliam M. Jubilee Vivero, MS, RD LDN Inpatient Clinical Dietitian Pager [phone]

## 2017-01-31 NOTE — Plan of Care (Signed)
Problem: Bowel/Gastric: Goal: Will not experience complications related to bowel motility Outcome: Progressing RN will administer colace per MD order and assess bowel regularity.

## 2017-01-31 NOTE — Care Management (Addendum)
Patient for discharge home today.  Spoke with attending and patient will not discharge home on insulin.  Discussed need to instruct patient on glucose monitoring during progression.    Instructed to proceed to [facility] to obtain her Eliquis and metformin.  An appointment has been made with [facility] and patient will decide if she is going to pursue [facility] or [facility]. Provided Eliquis coupon.  Obtained dietician consult per patient request. Provided with contact information for financial counsellors

## 2017-01-31 NOTE — Progress Notes (Signed)
ANTICOAGULATION CONSULT NOTE - Initial Consult  Pharmacy Consult for Apixaban Indication: pulmonary embolus  No Known Allergies  Patient Measurements: Height: 5\' 5"  (165.1 cm) Weight: 278 lb 8 oz (126.3 kg) IBW/kg (Calculated) : 57   Vital Signs: Temp: 98.6 F (37 C) (06/06 0440) Temp Source: Oral (06/06 0440) BP: 137/71 (06/06 0440) Pulse Rate: 105 (06/06 0440)  Labs:  Recent Labs  01/29/17 2350 01/31/17 0358  HGB 12.2  --   HCT 37.3  --   PLT 298  --   LABPROT  --  14.2  INR  --  1.10  CREATININE 0.51  --   TROPONINI <0.03  --     Estimated Creatinine Clearance: 148.7 mL/min (by C-G formula based on SCr of 0.51 mg/dL).   Medical History: Past Medical History:  Diagnosis Date  . Diabetes mellitus without complication [facility](HCC)      Assessment: 21 yo with Chest pain, significantly elevated D-dimer. No definitive PE however a pulmonary infarction is present. Initially started on enoxaparin now transitioning to apixaban. Last dose of enoxaparin 6/6 at 0530. Will start first dose of apixaban at 1700 (~12 h since last enoxaparin dose).   Goal of Therapy:  Monitor platelets by anticoagulation protocol: Yes  Monitor Serum Creatine per protocol   Plan:  Apixaban 10 mg PO BID x7 days then apixaban 5 mg PO BID SCr and CBC every three days  Pharmacy will continue to follow   Crist FatWang, Diane Mochizuki L 01/31/2017,11:23 AM

## 2017-01-31 NOTE — Discharge Summary (Signed)
[employer] - [place] at [facility]   PATIENT NAME: Kathleen Green    MR#:  [id number]  DATE OF BIRTH:  03-27-1996  DATE OF ADMISSION:  01/30/2017   ADMITTING PHYSICIAN: Arnaldo Natal, MD  DATE OF DISCHARGE: 01/31/2017 PRIMARY CARE PHYSICIAN: [facility]   ADMISSION DIAGNOSIS:  Pulmonary infarct [facility]) [I26.99] Hyperglycemia [R73.9] Chest pain [R07.9] Other acute pulmonary embolism without acute cor pulmonale (HCC) [I26.99] DISCHARGE DIAGNOSIS:  Active Problems:   Atypical chest pain  Pulmonary embolism: With infarction SECONDARY DIAGNOSIS:   Past Medical History:  Diagnosis Date  . Diabetes mellitus without complication [facility])    HOSPITAL COURSE:   1. Pulmonary embolism: With infarction; no hypoxia.   No right heart strain demonstrated on CT scan. Started on therapeutic Lovenox. Change to Eliquis. Hematology consult, per Dr. Merlene Pulling, LFTs, Factor V Leiden, prothrombin gene mutation, lupus anticoagulant panel, anticardiolipin antibodies, beta-2 glycoprotein antibodies. Plan to check protein C, protein S, and AT III activity and antigen in 3 months (after acute clot). Follow-up as outpatient. [place]: no DVT.  2. Diabetes mellitus type 2: New onset with hyperglycemia She is on sliding scale insulin. Started lantus. Hemoglobin A1c 12.2. I start metformin 1000 mg twice a day. Follow-up PCP for treatment adjustment.  3. Morbid obesity: BMI 45; encourage healthy diet and exercise  DISCHARGE CONDITIONS:  Stable, discharge to home today. CONSULTS OBTAINED:  Treatment Team:  Rosey Bath, MD DRUG ALLERGIES:  No Known Allergies DISCHARGE MEDICATIONS:   Allergies as of 01/31/2017   No Known Allergies     Medication List    TAKE these medications   apixaban 5 MG Tabs tablet Commonly known as:  ELIQUIS 10 mg po bid for 7 days, then 5 mg po bid.   metFORMIN 1000 MG tablet Commonly known as:  GLUCOPHAGE Take 1  tablet (1,000 mg total) by mouth 2 (two) times daily with a meal.        DISCHARGE INSTRUCTIONS:  See AVS.  If you experience worsening of your admission symptoms, develop shortness of breath, life threatening emergency, suicidal or homicidal thoughts you must seek medical attention immediately by calling 911 or calling your MD immediately  if symptoms less severe.  You Must read complete instructions/literature along with all the possible adverse reactions/side effects for all the Medicines you take and that have been prescribed to you. Take any new Medicines after you have completely understood and accpet all the possible adverse reactions/side effects.   Please note  You were cared for by a hospitalist during your hospital stay. If you have any questions about your discharge medications or the care you received while you were in the hospital after you are discharged, you can call the unit and asked to speak with the hospitalist on call if the hospitalist that took care of you is not available. Once you are discharged, your primary care physician will handle any further medical issues. Please note that NO REFILLS for any discharge medications will be authorized once you are discharged, as it is imperative that you return to your primary care physician (or establish a relationship with a primary care physician if you do not have one) for your aftercare needs so that they can reassess your need for medications and monitor your lab values.    On the day of Discharge:  VITAL SIGNS:  Blood pressure 117/62, pulse (!) 106, temperature 98.8 F (37.1 C), temperature source Oral, resp. rate 20, height 5\' 5"  (  1.651 m), weight 278 lb 8 oz (126.3 kg), last menstrual period 01/15/2017, SpO2 97 %. PHYSICAL EXAMINATION:  GENERAL:  21 y.o.-year-old patient lying in the bed with no acute distress. Morbid obesity. EYES: Pupils equal, round, reactive to light and accommodation. No scleral icterus. Extraocular  muscles intact.  HEENT: Head atraumatic, normocephalic. Oropharynx and nasopharynx clear.  NECK:  Supple, no jugular venous distention. No thyroid enlargement, no tenderness.  LUNGS: Normal breath sounds bilaterally, no wheezing, rales,rhonchi or crepitation. No use of accessory muscles of respiration.  CARDIOVASCULAR: S1, S2 normal. No murmurs, rubs, or gallops.  ABDOMEN: Soft, non-tender, non-distended. Bowel sounds present. No organomegaly or mass.  EXTREMITIES: No pedal edema, cyanosis, or clubbing.  NEUROLOGIC: Cranial nerves II through XII are intact. Muscle strength 5/5 in all extremities. Sensation intact. Gait not checked.  PSYCHIATRIC: The patient is alert and oriented x 3.  SKIN: No obvious rash, lesion, or ulcer.  DATA REVIEW:   CBC  Recent Labs Lab 01/29/17 2350  WBC 10.4  HGB 12.2  HCT 37.3  PLT 298    Chemistries   Recent Labs Lab 01/29/17 2350 01/31/17 0358  NA 134*  --   K 4.2  --   CL 97*  --   CO2 27  --   GLUCOSE 469*  --   BUN 6  --   CREATININE 0.51  --   CALCIUM 9.4  --   AST  --  78*  ALT  --  97*  ALKPHOS  --  59  BILITOT  --  0.8     Microbiology Results  No results found for this or any previous visit.  RADIOLOGY:  [place] Venous Img Lower Bilateral  Result Date: 01/31/2017 CLINICAL DATA:  Pulmonary embolus. EXAM: BILATERAL LOWER EXTREMITY VENOUS DOPPLER ULTRASOUND TECHNIQUE: Gray-scale sonography with graded compression, as well as color Doppler and duplex ultrasound were performed to evaluate the lower extremity deep venous systems from the level of the common femoral vein and including the common femoral, femoral, profunda femoral, popliteal and calf veins including the posterior tibial, peroneal and gastrocnemius veins when visible. The superficial great saphenous vein was also interrogated. Spectral Doppler was utilized to evaluate flow at rest and with distal augmentation maneuvers in the common femoral, femoral and popliteal veins.  COMPARISON:  None. FINDINGS: RIGHT LOWER EXTREMITY Common Femoral Vein: No evidence of thrombus. Normal compressibility, respiratory phasicity and response to augmentation. Saphenofemoral Junction: No evidence of thrombus. Normal compressibility and flow on color Doppler imaging. Profunda Femoral Vein: No evidence of thrombus. Normal compressibility and flow on color Doppler imaging. Femoral Vein: No evidence of thrombus. Normal compressibility, respiratory phasicity and response to augmentation. Popliteal Vein: No evidence of thrombus. Normal compressibility, respiratory phasicity and response to augmentation. Calf Veins: No evidence of thrombus. Normal compressibility and flow on color Doppler imaging. Superficial Great Saphenous Vein: No evidence of thrombus. Normal compressibility and flow on color Doppler imaging. Venous Reflux:  None. Other Findings:  None. LEFT LOWER EXTREMITY Common Femoral Vein: No evidence of thrombus. Normal compressibility, respiratory phasicity and response to augmentation. Saphenofemoral Junction: No evidence of thrombus. Normal compressibility and flow on color Doppler imaging. Profunda Femoral Vein: No evidence of thrombus. Normal compressibility and flow on color Doppler imaging. Femoral Vein: No evidence of thrombus. Normal compressibility, respiratory phasicity and response to augmentation. Popliteal Vein: No evidence of thrombus. Normal compressibility, respiratory phasicity and response to augmentation. Calf Veins: No evidence of thrombus. Normal compressibility and flow on color Doppler imaging. Superficial Great  Saphenous Vein: No evidence of thrombus. Normal compressibility and flow on color Doppler imaging. Venous Reflux:  None. Other Findings: Prominent but benign appearing lymph nodes in both inguinal regions. IMPRESSION: No evidence of DVT within either lower extremity. Electronically Signed   By: Francene Boyers M.D.   On: 01/31/2017 11:31     Management plans  discussed with the patient, her mother and other family members and they are in agreement.  CODE STATUS: Full Code   TOTAL TIME TAKING CARE OF THIS PATIENT: 36 minutes.    Shaune Pollack M.D on 01/31/2017 at 2:29 PM  Between 7am to 6pm - Pager - [phone]  After 6pm go to [email or web address] - Scientist, research (life sciences) [facility]  Office  [phone]  CC: Primary care physician; [facility]   Note: This dictation was prepared with Nurse, children's dictation along with smaller phrase technology. Any transcriptional errors that result from this process are unintentional.

## 2017-01-31 NOTE — Progress Notes (Signed)
Inpatient Diabetes Program Recommendations  AACE/ADA: New Consensus Statement on Inpatient Glycemic Control (2015)  Target Ranges:  Prepandial:   less than 140 mg/dL      Peak postprandial:   less than 180 mg/dL (1-2 hours)      Critically ill patients:  140 - 180 mg/dL   Results for Kathleen Green, Kathleen Green (MRN [id number]) as of 01/31/2017 12:19  Ref. Range 01/31/2017 07:39 01/31/2017 11:42  Glucose-Capillary Latest Ref Range: 65 - 99 mg/dL 409213 (H) 811257 (H)    DM Coordinator spoke with patient at length by phone yesterday (see note from yesterday 06/05).    Spoke with RN caring for pt today.  Per RN, patient to d/c home on Metformin 1000 mg BID today.  Called patient by phone (DM Coordinator not on [facility] today) and discussed restarting Metformin at home.  Reviewed how to take Metformin with meals, when to take, side effects, etc.  Patient stated she knows how to check CBGs and that Care management gave her a meter to take home.  Has used glucometer in the past.  Encouraged pt to check her CBGs at least 1-2 times daily (either before meals or 2-hours after meals) and to keep a record of all her CBGs for her PCP.  Reviewed CBG goals at home (80-130 mg/dl before meals and <914<180 mg/dl after meals).  Encouraged pt to remove all carbohydrates and sugars from beverages and to be careful with her portion sizes of carbohydrate containing foods (like fruit, breads, grains, sweets).    Patient stated [facility].  Will likely get her Metformin from [employer] for $4.  Did not have any other questions for me at this time.     --Will follow patient during hospitalization--  Ambrose FinlandJeannine Johnston Tanaya Dunigan RN, MSN, CDE Diabetes Coordinator Inpatient Glycemic Control Team Team Pager: [phone] (8a-5p)

## 2017-01-31 NOTE — Discharge Instructions (Signed)
Heart healthy and ADA diet. °

## 2017-01-31 NOTE — Care Management Note (Signed)
Case Management Note  Patient Details  Name: Kathleen Green MRN: [id number] Date of Birth: March 18, 1996  Subjective/Objective:                    Action/Plan:   Provided with glucose monitor and supplies.  Discussed the need to initiate glucose monitoring and insulin injection education during progression  Expected Discharge Date:                  Expected Discharge Plan:     In-House Referral:     Discharge planning Services     Post Acute Care Choice:    Choice offered to:     DME Arranged:    DME Agency:     HH Arranged:    HH Agency:     Status of Service:     If discussed at [employer] Meetings, dates discussed:    Additional Comments:  Eber HongGreene, Zilda No R, RN 01/31/2017, 9:46 AM

## 2017-02-01 LAB — BETA-2-GLYCOPROTEIN I ABS, IGG/M/A
Beta-2 Glyco I IgG: <9 GPI IgG units (ref 0–20)
Beta-2-Glycoprotein I IgA: <9 GPI IgA units (ref 0–25)
Beta-2-Glycoprotein I IgM: <9 GPI IgM units (ref 0–32)

## 2017-02-01 LAB — LUPUS ANTICOAGULANT PANEL
DRVVT: 40.3 s (ref 0.0–47.0)
PTT Lupus Anticoagulant: 45 s (ref 0.0–51.9)

## 2017-02-01 LAB — CARDIOLIPIN ANTIBODIES, IGG, IGM, IGA
Anticardiolipin IgA: <9 APL U/mL (ref 0–11)
Anticardiolipin IgG: <9 GPL U/mL (ref 0–14)
Anticardiolipin IgM: <9 MPL U/mL (ref 0–12)

## 2017-02-05 ENCOUNTER — Emergency Department: Payer: Self-pay

## 2017-02-05 ENCOUNTER — Emergency Department
Admission: EM | Admit: 2017-02-05 | Discharge: 2017-02-05 | Disposition: A | Discharge status: Home / Self Care | Payer: Self-pay | Attending: Emergency Medicine | Admitting: Emergency Medicine

## 2017-02-05 ENCOUNTER — Encounter: Payer: Self-pay | Admitting: Emergency Medicine

## 2017-02-05 DIAGNOSIS — I2699 Other pulmonary embolism without acute cor pulmonale: Secondary | ICD-10-CM | POA: Insufficient documentation

## 2017-02-05 DIAGNOSIS — R0602 Shortness of breath: Secondary | ICD-10-CM | POA: Insufficient documentation

## 2017-02-05 DIAGNOSIS — Z7984 Long term (current) use of oral hypoglycemic drugs: Secondary | ICD-10-CM | POA: Insufficient documentation

## 2017-02-05 DIAGNOSIS — Z7901 Long term (current) use of anticoagulants: Secondary | ICD-10-CM | POA: Insufficient documentation

## 2017-02-05 DIAGNOSIS — E119 Type 2 diabetes mellitus without complications: Secondary | ICD-10-CM | POA: Insufficient documentation

## 2017-02-05 HISTORY — DX: Other pulmonary embolism without acute cor pulmonale: I26.99

## 2017-02-05 LAB — CBC
HCT: 38.7 % (ref 35.0–47.0)
HEMOGLOBIN: 12.8 g/dL (ref 12.0–16.0)
MCH: 24.9 pg — AB (ref 26.0–34.0)
MCHC: 33 g/dL (ref 32.0–36.0)
MCV: 75.4 fL — ABNORMAL LOW (ref 80.0–100.0)
Platelets: 457 10*3/uL — ABNORMAL HIGH (ref 150–440)
RBC: 5.13 MIL/uL (ref 3.80–5.20)
RDW: 13.8 % (ref 11.5–14.5)
WBC: 18.4 10*3/uL — AB (ref 3.6–11.0)

## 2017-02-05 LAB — BASIC METABOLIC PANEL
ANION GAP: 11 (ref 5–15)
BUN: 10 mg/dL (ref 6–20)
CALCIUM: 9.3 mg/dL (ref 8.9–10.3)
CO2: 25 mmol/L (ref 22–32)
Chloride: 98 mmol/L — ABNORMAL LOW (ref 101–111)
Creatinine, Ser: 0.47 mg/dL (ref 0.44–1.00)
Glucose, Bld: 361 mg/dL — ABNORMAL HIGH (ref 65–99)
Potassium: 4.1 mmol/L (ref 3.5–5.1)
Sodium: 134 mmol/L — ABNORMAL LOW (ref 135–145)

## 2017-02-05 LAB — BRAIN NATRIURETIC PEPTIDE: B NATRIURETIC PEPTIDE 5: 13 pg/mL (ref 0.0–100.0)

## 2017-02-05 LAB — HCG, QUANTITATIVE, PREGNANCY

## 2017-02-05 LAB — PROTHROMBIN GENE MUTATION

## 2017-02-05 LAB — PROTIME-INR
INR: 0.98
PROTHROMBIN TIME: 13 s (ref 11.4–15.2)

## 2017-02-05 LAB — FACTOR 5 LEIDEN

## 2017-02-05 LAB — TROPONIN I

## 2017-02-05 MED ORDER — SODIUM CHLORIDE 0.9 % IV BOLUS (SEPSIS)
1000.0000 mL | Freq: Once | INTRAVENOUS | Status: AC
  Administered 2017-02-05: 1000 mL via INTRAVENOUS

## 2017-02-05 MED ORDER — IOPAMIDOL (ISOVUE-370) INJECTION 76%
75.0000 mL | Freq: Once | INTRAVENOUS | Status: AC | PRN
  Administered 2017-02-05: 75 mL via INTRAVENOUS

## 2017-02-05 MED ORDER — HYDROCODONE-ACETAMINOPHEN 5-325 MG PO TABS: 1.0000 | ORAL_TABLET | Freq: Four times a day (QID) | ORAL | 0 refills | Status: DC | PRN

## 2017-02-05 MED ORDER — KETOROLAC TROMETHAMINE 30 MG/ML IJ SOLN
15.0000 mg | Freq: Once | INTRAMUSCULAR | Status: AC
  Administered 2017-02-05: 15 mg via INTRAVENOUS
  Filled 2017-02-05: qty 1

## 2017-02-05 MED ORDER — FENTANYL CITRATE (PF) 100 MCG/2ML IJ SOLN
50.0000 ug | Freq: Once | INTRAMUSCULAR | Status: AC
  Administered 2017-02-05: 50 ug via INTRAVENOUS
  Filled 2017-02-05: qty 2

## 2017-02-05 NOTE — ED Provider Notes (Signed)
[facility] Emergency Department Provider Note  ____________________________________________   First MD Initiated Contact with Patient 02/05/17 [phone]     (approximate)  I have reviewed the triage vital signs and the nursing notes.   HISTORY  Chief Complaint Shortness of Breath    HPI Kathleen Green is a 21 y.o. female who self presents to the emergency department with sudden severe right chest pain worse when taking a deep breath and improved with rest. The pain began this morning. 6 days ago she was seen in our emergency department for similar symptoms and was diagnosed with a pulmonary infarction and likely pulmonary embolism. She was placed on Eliquis and reports compliance with the medications. No clear etiology or reason for her pulmonary embolism has been found yet. She denies fevers or chills. She denies cough. She denies abdominal pain nausea or vomiting. She denies leg swelling.   Past Medical History:  Diagnosis Date  . Diabetes mellitus without complication (HCC)   . PE (pulmonary thromboembolism) [facility])     Patient Active Problem List   Diagnosis Date Noted  . Atypical chest pain 01/30/2017    Past Surgical History:  Procedure Laterality Date  . APPENDECTOMY      Prior to Admission medications   Medication Sig Start Date End Date Taking? Authorizing Provider  apixaban (ELIQUIS) 5 MG TABS tablet 10 mg po bid for 7 days, then 5 mg po bid. 01/31/17  Yes Shaune Pollack, MD  metFORMIN (GLUCOPHAGE) 1000 MG tablet Take 1 tablet (1,000 mg total) by mouth 2 (two) times daily with a meal. 01/31/17  Yes Shaune Pollack, MD  HYDROcodone-acetaminophen [facility]) 5-325 MG tablet Take 1 tablet by mouth every 6 (six) hours as needed for severe pain. 02/05/17   Merrily Brittle, MD    Allergies Patient has no known allergies.  Family History  Problem Relation Age of Onset  . Diabetes Mellitus II Paternal Grandmother   . Diabetes Mellitus II Paternal  Grandfather   . Clotting disorder Maternal Grandfather     Social History Social History  Substance Use Topics  . Smoking status: Never Smoker  . Smokeless tobacco: Never Used  . Alcohol use No    Review of Systems Constitutional: No fever/chills Eyes: No visual changes. ENT: No sore throat. Cardiovascular: Positive chest pain. Respiratory: Positive shortness of breath. Gastrointestinal: No abdominal pain.  No nausea, no vomiting.  No diarrhea.  No constipation. Genitourinary: Negative for dysuria. Musculoskeletal: Negative for back pain. Skin: Negative for rash. Neurological: Negative for headaches, focal weakness or numbness.   ____________________________________________   PHYSICAL EXAM:  VITAL SIGNS: ED Triage Vitals  Enc Vitals Group     BP 02/05/17 0818 133/84     Pulse Rate 02/05/17 0818 (!) 106     Resp 02/05/17 0818 (!) 22     Temp 02/05/17 0818 97.6 F (36.4 C)     Temp Source 02/05/17 0818 Oral     SpO2 02/05/17 0818 97 %     Weight 02/05/17 0819 270 lb (122.5 kg)     Height 02/05/17 0819 5\' 5"  (1.651 m)     Head Circumference --      Peak Flow --      Pain Score 02/05/17 0820 10     Pain Loc --      Pain Edu? --      Excl. in GC? --     Constitutional: Alert and oriented x 4 Appears uncomfortable with elevated respiratory rate Eyes: PERRL  EOMI. Head: Atraumatic. Nose: No congestion/rhinnorhea. Mouth/Throat: No trismus Neck: No stridor.   Cardiovascular: Tachycardic rate, regular rhythm. Grossly normal heart sounds.  Good peripheral circulation. Respiratory: Taking shallow frequent breaths.  No retractions. Lungs CTAB and moving good air Gastrointestinal: Obese nontender Musculoskeletal: No lower extremity edema   Neurologic:  Normal speech and language. No gross focal neurologic deficits are appreciated. Skin:  Skin is warm, dry and intact. No rash noted. Psychiatric: Mood and affect are normal. Speech and behavior are  normal.    ____________________________________________   DIFFERENTIAL  Pulmonary embolism, pulmonary infarction, pneumonia, pneumothorax ____________________________________________   LABS (all labs ordered are listed, but only abnormal results are displayed)  Labs Reviewed  BASIC METABOLIC PANEL - Abnormal; Notable for the following:       Result Value   Sodium 134 (*)    Chloride 98 (*)    Glucose, Bld 361 (*)    All other components within normal limits  CBC - Abnormal; Notable for the following:    WBC 18.4 (*)    MCV 75.4 (*)    MCH 24.9 (*)    Platelets 457 (*)    All other components within normal limits  TROPONIN I  HCG, QUANTITATIVE, PREGNANCY  PROTIME-INR  BRAIN NATRIURETIC PEPTIDE    Normal troponin and BNP __________________________________________  EKG  ED ECG REPORT I, Merrily Brittle, the attending physician, personally viewed and interpreted this ECG.  Date: 02/05/2017 Rate: 100 Rhythm: normal sinus rhythm QRS Axis: normal Intervals: normal ST/T Wave abnormalities: normal Conduction Disturbances: none Narrative Interpretation: The patient has an S1 Q3 T3 pattern and is abnormal but unchanged from previous  ____________________________________________  RADIOLOGY  CT scan with continued pulmonary infarction and no new clot ____________________________________________   PROCEDURES  Procedure(s) performed: no  Procedures  Critical Care performed: no  Observation: no ____________________________________________   INITIAL IMPRESSION / ASSESSMENT AND PLAN / ED COURSE  Pertinent labs & imaging results that were available during my care of the patient were reviewed by me and considered in my medical decision making (see chart for details).  The patient arrives extremely short of breath to Make an uncomfortable appearing. Differential is either continuation of her pulmonary infarction versus extension of her pulmonary embolism. Eliquis  and the other and the other NOACs tends not to work as well in obese people's was possible she is a failure. Pain medication and CT scan are pending.     ----------------------------------------- 11:10 AM on 02/05/2017 -----------------------------------------  The patient's CT scan does not show any further progression of the clot but does confirm continued infarction. Her pain is significantly improved at this point although she does remain tachycardic. She may require inpatient admission for pain cannot be well controlled. ____________________________________________  ----------------------------------------- 11:56 AM on 02/05/2017 -----------------------------------------  The patient's pain remains adequately controlled and she would like to go home. I appreciate her persistent tachycardia but this is likely secondary to pain and continued infarction. She has follow-up in 2 days with hematology oncology which I think is reasonable. Strict return precautions given. She is discharged home with her husband.   FINAL CLINICAL IMPRESSION(S) / ED DIAGNOSES  Final diagnoses:  Pulmonary infarction (HCC)      NEW MEDICATIONS STARTED DURING THIS VISIT:  New Prescriptions   HYDROCODONE-ACETAMINOPHEN (NORCO) 5-325 MG TABLET    Take 1 tablet by mouth every 6 (six) hours as needed for severe pain.     Note:  This document was prepared using Conservation officer, historic buildings and may  include unintentional dictation errors.     Merrily Brittleifenbark, [place], MD 02/05/17 1156

## 2017-02-05 NOTE — ED Triage Notes (Signed)
Severe chest pain began this am. Diagnosed with PE one week ago, taking Eloquis, pain had been better til this am.

## 2017-02-05 NOTE — Discharge Instructions (Signed)
Please keep your appointment with your oncologist in 2 days as scheduled. Use 600 mg of ibuprofen 3 times a day as needed for pain and use Vicodin only when your pain is more severe. Return to the emergency department for any concerns such as worsening pain, shortness of breath, or for any other concerns.  It was a pleasure to take care of you today, and thank you for coming to our emergency department.  If you have any questions or concerns before leaving please ask the nurse to grab me and I'm more than happy to go through your aftercare instructions again.  If you were prescribed any opioid pain medication today such as Norco, Vicodin, Percocet, morphine, hydrocodone, or oxycodone please make sure you do not drive when you are taking this medication as it can alter your ability to drive safely.  If you have any concerns once you are home that you are not improving or are in fact getting worse before you can make it to your follow-up appointment, please do not hesitate to call 911 and come back for further evaluation.  Merrily Brittle MD  Results for orders placed or performed during the hospital encounter of 02/05/17  Basic metabolic panel  Result Value Ref Range   Sodium 134 (L) 135 - 145 mmol/L   Potassium 4.1 3.5 - 5.1 mmol/L   Chloride 98 (L) 101 - 111 mmol/L   CO2 25 22 - 32 mmol/L   Glucose, Bld 361 (H) 65 - 99 mg/dL   BUN 10 6 - 20 mg/dL   Creatinine, Ser 1.61 0.44 - 1.00 mg/dL   Calcium 9.3 8.9 - 09.6 mg/dL   GFR calc non Af Amer >60 >60 mL/min   GFR calc Af Amer >60 >60 mL/min   Anion gap 11 5 - 15  CBC  Result Value Ref Range   WBC 18.4 (H) 3.6 - 11.0 K/uL   RBC 5.13 3.80 - 5.20 MIL/uL   Hemoglobin 12.8 12.0 - 16.0 g/dL   HCT 04.5 40.9 - 81.1 %   MCV 75.4 (L) 80.0 - 100.0 fL   MCH 24.9 (L) 26.0 - 34.0 pg   MCHC 33.0 32.0 - 36.0 g/dL   RDW 91.4 78.2 - 95.6 %   Platelets 457 (H) 150 - 440 K/uL  Troponin I  Result Value Ref Range   Troponin I <0.03 <0.03 ng/mL  hCG,  quantitative, pregnancy  Result Value Ref Range   hCG, Beta Chain, Quant, S <1 <5 mIU/mL  Protime-INR  Result Value Ref Range   Prothrombin Time 13.0 11.4 - 15.2 seconds   INR 0.98   Brain natriuretic peptide  Result Value Ref Range   B Natriuretic Peptide 13.0 0.0 - 100.0 pg/mL   Dg Chest 2 View  Result Date: 01/30/2017 CLINICAL DATA:  Mid chest pain for 2 days. EXAM: CHEST  2 VIEW COMPARISON:  03/10/2010 FINDINGS: Patchy opacity at the right costophrenic angle likely localizes to the right middle lobe on the lateral view. Overall lung volumes are low. Normal heart size and mediastinal contours. No pulmonary edema. No pleural fluid or pneumothorax. No acute osseous abnormalities. IMPRESSION: Patchy right lung base opacity likely localizing to the middle lobe on the lateral view. This may be atelectasis or pneumonia in the appropriate clinical setting. Electronically Signed   By: Rubye Oaks M.D.   On: 01/30/2017 00:31   Ct Angio Chest Pe W/cm &/or Wo Cm  Result Date: 02/05/2017 CLINICAL DATA:  Worsening dyspnea. Recent diagnosis of  acute pulmonary embolism. EXAM: CT ANGIOGRAPHY CHEST WITH CONTRAST TECHNIQUE: Multidetector CT imaging of the chest was performed using the standard protocol during bolus administration of intravenous contrast. Multiplanar CT image reconstructions and MIPs were obtained to evaluate the vascular anatomy. CONTRAST:  75 cc Isovue 370 IV. COMPARISON:  01/30/2017 chest CT angiogram. Chest radiograph from earlier today. FINDINGS: Cardiovascular: The study is low quality for the evaluation of pulmonary embolism due to respiratory motion artifact and suboptimal contrast opacification, which largely precludes evaluation of the segmental and subsegmental pulmonary arteries. No central pulmonary emboli. Stable suspected subsegmental pulmonary embolus in the right lower lobe (series 5/ image 103), now early subacute. No appreciable new filling defects within the lobar, segmental  or subsegmental pulmonary arteries. Great vessels are normal in course and caliber. Main pulmonary artery diameter 2.5 cm. Top-normal heart size. No significant pericardial fluid/thickening. Mediastinum/Nodes: No discrete thyroid nodules. Unremarkable esophagus. No axillary adenopathy. Mildly enlarged 1.1 cm upper right paratracheal node (series 4/ image 9), stable since 01/30/2017. Stable mildly enlarged 1.1 cm subcarinal node (series 4/image 34). Stable mildly enlarged 1.0 cm right infrahilar node (series 4/ image 36). No additional pathologically enlarged mediastinal or hilar nodes. Lungs/Pleura: No pneumothorax. Small dependent right pleural effusion is new. No left pleural effusion. Associated mild compressive atelectasis in the dependent right lower lobe. Peripheral right lower lobe wedge shaped 3.8 x 2.4 cm focus of consolidation (series 6/ image 38), slightly decreased from 4.4 x 2.5 cm on 01/30/2017. Two stable anterior right middle lobe solid pulmonary nodules, largest 4 mm (series 6/ image 34). No new significant pulmonary nodules. No new foci of consolidative airspace disease. Upper abdomen: Unremarkable. Musculoskeletal:  No aggressive appearing focal osseous lesions. Review of the MIP images confirms the above findings. IMPRESSION: 1. Limited scan. Previously suspected subsegmental pulmonary embolus in the right lower lobe is stable. No evidence of new pulmonary emboli since 01/30/2017 chest CT angiogram. 2. Peripheral right lower lobe wedge shaped focus of consolidation is slightly decreased in size since 01/30/2017, most compatible with early subacute pulmonary infarct . 3. New small dependent right pleural effusion with associated right lower lobe compressive atelectasis. 4. Two right middle lobe solid pulmonary nodules, largest 4 mm, stable. No further follow-up required unless the patient has significant risk factors for lung malignancy. 5. Stable mild mediastinal and right infrahilar adenopathy,  nonspecific, probably reactive. Electronically Signed   By: Delbert Phenix M.D.   On: 02/05/2017 10:55   Ct Angio Chest Pe W Or Wo Contrast  Result Date: 01/30/2017 CLINICAL DATA:  Chest pain. EXAM: CT ANGIOGRAPHY CHEST WITH CONTRAST TECHNIQUE: Multidetector CT imaging of the chest was performed using the standard protocol during bolus administration of intravenous contrast. Multiplanar CT image reconstructions and MIPs were obtained to evaluate the vascular anatomy. CONTRAST:  75 cc Isovue 370 IV COMPARISON:  Chest radiograph earlier this day. FINDINGS: Cardiovascular: There are no filling defects in the central pulmonary arteries to suggest pulmonary embolus, however the subsegmental branches are not well opacified due to soft tissue attenuation from habitus. Thoracic aorta is normal in caliber. The heart is normal in size. No evidence of right heart strain. Mediastinum/Nodes: Prominent right hilar node measures 11 mm. No mediastinal or left hilar adenopathy. No axillary adenopathy. The esoph There are no acute or suspicious osseous abnormalities. agus is decompressed. Visualized thyroid gland is normal. Lungs/Pleura: Peripheral wedge-shaped consolidation in the right lower lobe with surrounding ground-glass opacity. Left lung is clear. No pleural fluid. Upper Abdomen: Liver appears prominent size,  only partially included. Musculoskeletal: There are no acute or suspicious osseous abnormalities. Review of the MIP images confirms the above findings. IMPRESSION: Peripheral wedge-shaped consolidation in the right lower lobe is highly suspicious for pulmonary infarct. There is no central pulmonary arterial filling defect, however distal branches are not well opacified suspect right lower lobe pulmonary embolus is not well visualized on the current exam. Overall findings are highly suggestive of acute pulmonary embolus. These results were called by telephone at the time of interpretation on 01/30/2017 at 5:28 am to Dr.  Lucrezia EuropeALLISON WEBSTER , who verbally acknowledged these results. Electronically Signed   By: Rubye OaksMelanie  Ehinger M.D.   On: 01/30/2017 05:29   [place] Venous Img Lower Bilateral  Result Date: 01/31/2017 CLINICAL DATA:  Pulmonary embolus. EXAM: BILATERAL LOWER EXTREMITY VENOUS DOPPLER ULTRASOUND TECHNIQUE: Gray-scale sonography with graded compression, as well as color Doppler and duplex ultrasound were performed to evaluate the lower extremity deep venous systems from the level of the common femoral vein and including the common femoral, femoral, profunda femoral, popliteal and calf veins including the posterior tibial, peroneal and gastrocnemius veins when visible. The superficial great saphenous vein was also interrogated. Spectral Doppler was utilized to evaluate flow at rest and with distal augmentation maneuvers in the common femoral, femoral and popliteal veins. COMPARISON:  None. FINDINGS: RIGHT LOWER EXTREMITY Common Femoral Vein: No evidence of thrombus. Normal compressibility, respiratory phasicity and response to augmentation. Saphenofemoral Junction: No evidence of thrombus. Normal compressibility and flow on color Doppler imaging. Profunda Femoral Vein: No evidence of thrombus. Normal compressibility and flow on color Doppler imaging. Femoral Vein: No evidence of thrombus. Normal compressibility, respiratory phasicity and response to augmentation. Popliteal Vein: No evidence of thrombus. Normal compressibility, respiratory phasicity and response to augmentation. Calf Veins: No evidence of thrombus. Normal compressibility and flow on color Doppler imaging. Superficial Great Saphenous Vein: No evidence of thrombus. Normal compressibility and flow on color Doppler imaging. Venous Reflux:  None. Other Findings:  None. LEFT LOWER EXTREMITY Common Femoral Vein: No evidence of thrombus. Normal compressibility, respiratory phasicity and response to augmentation. Saphenofemoral Junction: No evidence of thrombus. Normal  compressibility and flow on color Doppler imaging. Profunda Femoral Vein: No evidence of thrombus. Normal compressibility and flow on color Doppler imaging. Femoral Vein: No evidence of thrombus. Normal compressibility, respiratory phasicity and response to augmentation. Popliteal Vein: No evidence of thrombus. Normal compressibility, respiratory phasicity and response to augmentation. Calf Veins: No evidence of thrombus. Normal compressibility and flow on color Doppler imaging. Superficial Great Saphenous Vein: No evidence of thrombus. Normal compressibility and flow on color Doppler imaging. Venous Reflux:  None. Other Findings: Prominent but benign appearing lymph nodes in both inguinal regions. IMPRESSION: No evidence of DVT within either lower extremity. Electronically Signed   By: Francene BoyersJames  Maxwell M.D.   On: 01/31/2017 11:31   Dg Chest Port 1 View  Result Date: 02/05/2017 CLINICAL DATA:  Severe right-sided chest pain, diagnosed with PE last week EXAM: PORTABLE CHEST 1 VIEW COMPARISON:  CT chest dated 01/30/2017 FINDINGS: Low lung volumes. Patchy lateral right lower lobe opacity, corresponding to suspected pulmonary infarct on CT, less likely infection. Small right pleural effusion is possible. Left lung is clear.  No pneumothorax. The heart is top-normal in size for inspiration. IMPRESSION: Patchy lateral right lower lobe opacity, corresponding to suspected pulmonary infarct on CT, less likely infection. Small right pleural effusion is possible. Electronically Signed   By: Charline BillsSriyesh  Krishnan M.D.   On: 02/05/2017 09:47

## 2017-02-05 NOTE — ED Triage Notes (Signed)
Pt reports being dx with PE last week, reports right shoulder pain and increased pain in chest and shortness of breath.

## 2017-02-16 ENCOUNTER — Inpatient Hospital Stay: Payer: Self-pay | Admitting: Hematology and Oncology

## 2017-02-16 DIAGNOSIS — I2699 Other pulmonary embolism without acute cor pulmonale: Secondary | ICD-10-CM | POA: Insufficient documentation

## 2017-02-16 NOTE — Progress Notes (Deleted)
[facility] day:  02/16/2017  Chief Complaint: Kathleen Green is a 21 y.o. female with a pulmonary embolism who is seen for assessment after recent hospitalization.  HPI:  The patient denies any past medical problems.  She has not been to the [facility] in several years.  She was in her usual state of good health until 01/27/2017 when she developed right shoulder pain which then radiated into her anterior chest. She described pleuritic chest pain. She denied any lower extremity edema. She noted a low grade temperature (99) with a brief chill.  She presented to the emergency room. D dimers were elevated. Chest x-ray revealed patchy right lung base opacity likely localizing to the middle lobe. Chest CT angiogram revealed peripheral wedge shaped consolidation in the right lower lobe highly suspicious for pulmonary infarct. There was no central pulmonary artery filling defect, however distal branches were not well opacified with suspect right lower lobe pulmonary embolism. Findings were highly suggestive of acute pulmonary embolism.  The patient was admitted to [facility] from 01/30/2017 - 01/31/2017.  The patient was started on therapeutic Lovenox.  Bilateral lower extremity duplex on 02/01/2015 revealed no evidence of DVT.  She was discharged on Eliquis.  Limited hypercoagulable work-up on 01/31/2017 revealed the following normal studies:  Factor V Leiden, prothrombin gene mutation, lupus anticoagulant panel, anticardiolipin antibodies, and beta-2 glycoprotein antibodies.  LFTs revealed an AST of 78 and ALT 97.  Albumen was 3.2 with a protein of 7.0.  She presented to the [facility] ER on 02/05/2017 with chest pain.  Chest CT angiogram revealed previously suspected subsegmental pulmonary embolus in the right lower lobe is stable. There was no evidence of new pulmonary emboli since 01/30/2017 chest CT angiogram.  There was peripheral right lower lobe  wedge shaped focus of consolidation slightly decreased in size since 01/30/2017, most compatible with early subacute pulmonary infarct.  there was new small dependent right pleural effusion with associated right lower lobe compressive atelectasis.  There were two right middle lobe solid pulmonary nodules, largest 4 mm, stable. No further follow-up required unless the patient has significant risk factors for lung malignancy.  There was stable mild mediastinal and right infrahilar adenopathy, nonspecific, probably reactive.  The patient notes a family history of thrombosis in her paternal grandfather. She states that his "blood clots easily". She notes that he had a CVA in his 9s.   She denies any history of tobacco use or birth control.  She denies any immobility or dehydration. She states that she goes to the gym 1-2 times a week and works out with legs curls, treadmill and bicycle.    Past Medical History:  Diagnosis Date  . Diabetes mellitus without complication ([place])   . PE (pulmonary thromboembolism) ([place])     Past Surgical History:  Procedure Laterality Date  . APPENDECTOMY      Family History  Problem Relation Age of Onset  . Diabetes Mellitus II Paternal Grandmother   . Diabetes Mellitus II Paternal Grandfather   . Clotting disorder Maternal Grandfather     Social History:  reports that she has never smoked. She has never used smokeless tobacco. She reports that she does not drink alcohol or use drugs.  She has a full-time job at [employer]. She goes to [employer] and is working on a degree in early childhood development. She graduates next year. She lives her grandparents and siblings.  Allergies: No Known Allergies  Current Medications: Current Outpatient Prescriptions  Medication Sig Dispense Refill  . apixaban (ELIQUIS) 5 MG TABS tablet 10 mg po bid for 7 days, then 5 mg po bid. 60 tablet 0  . HYDROcodone-acetaminophen (NORCO) 5-325 MG  tablet Take 1 tablet by mouth every 6 (six) hours as needed for severe pain. 11 tablet 0  . metFORMIN (GLUCOPHAGE) 1000 MG tablet Take 1 tablet (1,000 mg total) by mouth 2 (two) times daily with a meal. 60 tablet 0   No current facility-administered medications for this visit.     Review of Systems:  GENERAL:  Feels "ok".  Low grade temperature and brief chill prior to admission.  No weight loss. PERFORMANCE STATUS (ECOG):  1 HEENT:  No visual changes, runny nose, sore throat, mouth sores or tenderness. Lungs: No shortness of breath or cough.  No hemoptysis. Cardiac:  Pleuritic chest pain.  No palpitations, orthopnea, or PND. GI:  No nausea, vomiting, diarrhea, constipation, melena or hematochezia. GU:  Chronic urinary frequency.  No urgency, dysuria, or hematuria. Musculoskeletal:  No back pain.  No joint pain.  No muscle tenderness. Extremities:  No pain or swelling. Skin:  No rashes or skin changes. Neuro:  Headaches.  No numbness or weakness, balance or coordination issues. Endocrine:  No diabetes, thyroid issues, hot flashes or night sweats. Psych:  No mood changes, depression or anxiety. Pain:  No focal pain. Review of systems:  All other systems reviewed and found to be negative.  Physical Exam: There were no vitals taken for this visit. GENERAL:  Well developed, well nourished, heavyset young woman sitting comfortably on the medical unit in no acute distress. MENTAL STATUS:  Alert and oriented to person, place and time. HEAD:  Long brown hair.  Normocephalic, atraumatic, face symmetric, no Cushingoid features. EYES:  Brown eyes.  Pupils equal round and reactive to light and accomodation.  No conjunctivitis or scleral icterus. ENT:  Oropharynx clear without lesion.  Tongue normal. Mucous membranes moist.  RESPIRATORY:  Clear to auscultation without rales, wheezes or rhonchi. CARDIOVASCULAR:  Regular rate and rhythm without murmur, rub or gallop. ABDOMEN:  Soft, non-tender,  with active bowel sounds, and no appreciable hepatosplenomegaly.  No masses. SKIN:  No rashes, ulcers or lesions. EXTREMITIES: No edema, no skin discoloration or tenderness.  No palpable cords. LYMPH NODES: No palpable cervical, supraclavicular, axillary or inguinal adenopathy  NEUROLOGICAL: Unremarkable. PSYCH:  Appropriate.   No visits with results within 3 Day(s) from this visit.  Latest known visit with results is:  Admission on 02/05/2017, Discharged on 02/05/2017  Component Date Value Ref Range Status  . Sodium 02/05/2017 134* 135 - 145 mmol/L Final  . Potassium 02/05/2017 4.1  3.5 - 5.1 mmol/L Final  . Chloride 02/05/2017 98* 101 - 111 mmol/L Final  . CO2 02/05/2017 25  22 - 32 mmol/L Final  . Glucose, Bld 02/05/2017 361* 65 - 99 mg/dL Final  . BUN 02/05/2017 10  6 - 20 mg/dL Final  . Creatinine, Ser 02/05/2017 0.47  0.44 - 1.00 mg/dL Final  . Calcium 02/05/2017 9.3  8.9 - 10.3 mg/dL Final  . GFR calc non Af Amer 02/05/2017 >60  >60 mL/min Final  . GFR calc Af Amer 02/05/2017 >60  >60 mL/min Final   Comment: (NOTE) The eGFR has been calculated using the CKD EPI equation. This calculation has not been validated in all clinical situations. eGFR's persistently <60 mL/min signify possible Chronic Kidney Disease.   . Anion gap 02/05/2017 11  5 -   15 Final  . WBC 02/05/2017 18.4* 3.6 - 11.0 K/uL Final  . RBC 02/05/2017 5.13  3.80 - 5.20 MIL/uL Final  . Hemoglobin 02/05/2017 12.8  12.0 - 16.0 g/dL Final  . HCT 02/05/2017 38.7  35.0 - 47.0 % Final  . MCV 02/05/2017 75.4* 80.0 - 100.0 fL Final  . MCH 02/05/2017 24.9* 26.0 - 34.0 pg Final  . MCHC 02/05/2017 33.0  32.0 - 36.0 g/dL Final  . RDW 02/05/2017 13.8  11.5 - 14.5 % Final  . Platelets 02/05/2017 457* 150 - 440 K/uL Final  . Troponin I 02/05/2017 <0.03  <0.03 ng/mL Final  . hCG, Beta Chain, Quant, S 02/05/2017 <1  <5 mIU/mL Final   Comment:          GEST. AGE      CONC.  (mIU/mL)   <=1 WEEK        5 - 50     2 WEEKS        50 - 500     3 WEEKS       100 - 10,000     4 WEEKS     1,000 - 30,000     5 WEEKS     3,500 - 115,000   6-8 WEEKS     12,000 - 270,000    12 WEEKS     15,000 - 220,000        FEMALE AND NON-PREGNANT FEMALE:     LESS THAN 5 mIU/mL   . Prothrombin Time 02/05/2017 13.0  11.4 - 15.2 seconds Final  . INR 02/05/2017 0.98   Final  . B Natriuretic Peptide 02/05/2017 13.0  0.0 - 100.0 pg/mL Final    Assessment:  Kathleen Green is a 21 y.o. female with right lower lobe pulmonary embolism without apparent risk factors.  She is over weight.  She denies any tobacco use, birth control, immobility or dehydration.  She is currently on Lovenox.   Plan:   1.  Discuss risk factors for thrombosis and planned treatment.  Discuss at minimum 6 months of anticoagulation based on work-up. 2.  Lab:  LFTs, Factor V Leiden, prothrombin gene mutation, lupus anticoagulant panel, anticardiolipin antibodies, beta-2 glycoprotein antibodies. 3.  Plan to check protein C, protein S, and AT III activity and antigen in 3 months (after acute clot). 4.  Bilateral lower extremity duplex. 5.  Continue Lovenox with plan to convert to long term anticoagulant. 6.  Anticipate follow-up in the hematology clinic after discharge.   Lequita Asal, MD  02/16/2017, 4:44 AM

## 2017-02-19 ENCOUNTER — Ambulatory Visit: Payer: Self-pay | Admitting: Oncology

## 2018-03-04 ENCOUNTER — Encounter: Payer: Self-pay | Admitting: Emergency Medicine

## 2018-03-04 ENCOUNTER — Other Ambulatory Visit: Payer: Self-pay

## 2018-03-04 ENCOUNTER — Emergency Department
Admission: EM | Admit: 2018-03-04 | Discharge: 2018-03-04 | Disposition: A | Discharge status: Home / Self Care | Payer: Self-pay | Attending: Emergency Medicine | Admitting: Emergency Medicine

## 2018-03-04 DIAGNOSIS — Z7901 Long term (current) use of anticoagulants: Secondary | ICD-10-CM | POA: Insufficient documentation

## 2018-03-04 DIAGNOSIS — Z86711 Personal history of pulmonary embolism: Secondary | ICD-10-CM | POA: Insufficient documentation

## 2018-03-04 DIAGNOSIS — Z7984 Long term (current) use of oral hypoglycemic drugs: Secondary | ICD-10-CM | POA: Insufficient documentation

## 2018-03-04 DIAGNOSIS — E1165 Type 2 diabetes mellitus with hyperglycemia: Secondary | ICD-10-CM

## 2018-03-04 DIAGNOSIS — E119 Type 2 diabetes mellitus without complications: Secondary | ICD-10-CM | POA: Insufficient documentation

## 2018-03-04 DIAGNOSIS — L03116 Cellulitis of left lower limb: Secondary | ICD-10-CM | POA: Insufficient documentation

## 2018-03-04 DIAGNOSIS — L03119 Cellulitis of unspecified part of limb: Secondary | ICD-10-CM

## 2018-03-04 LAB — GLUCOSE, CAPILLARY: GLUCOSE-CAPILLARY: 370 mg/dL — AB (ref 70–99)

## 2018-03-04 MED ORDER — SULFAMETHOXAZOLE-TRIMETHOPRIM 800-160 MG PO TABS: 1.0000 | ORAL_TABLET | Freq: Two times a day (BID) | ORAL | 0 refills | Status: DC

## 2018-03-04 NOTE — ED Notes (Signed)
Patient has a red raised area to her left thigh.  Patient reports noticing area several days ago but feels it is getting worse and not better.  Patient is in no obvious distress at this time.

## 2018-03-04 NOTE — ED Triage Notes (Signed)
PT to ED via POV with c/o possible insect bite to LFT lower leg x2days ago. Redness and swelling noted. PT ambulatory, denies fever

## 2018-03-04 NOTE — Discharge Instructions (Addendum)
Follow-up with your primary care provider.  Call tomorrow to make an appointment to be seen next week.  Begin sticking to your diabetic diet and taking her medication as prescribed.  Your blood sugar should be less than 200.  Today's fingerstick was 370.

## 2018-03-04 NOTE — ED Provider Notes (Signed)
[facility] Emergency Department Provider Note  ____________________________________________   First MD Initiated Contact with Patient 03/04/18 1658     (approximate)  I have reviewed the triage vital signs and the nursing notes.   HISTORY  Chief Complaint No chief complaint on file.   HPI Kathleen Green is a 22 y.o. female is here with complaint of possible insect bite to her left lower leg 2 days ago.  She noticed that it is become red and swollen.  She denies any fever or chills.  There is been no nausea or vomiting.  Patient has not been taking any over-the-counter medication for this.  Patient was noted to be eating chips and had a Coke in the room.  When asked about her diabetes she states that she does not control it.  She also does not do any fingersticks to monitor her diabetes.  She does continue to take her metformin.  She rates her pain as an 8 out of 10.   Past Medical History:  Diagnosis Date  . Diabetes mellitus without complication (HCC)   . PE (pulmonary thromboembolism) [facility](HCC)     Patient Active Problem List   Diagnosis Date Noted  . Pulmonary embolism (HCC) 02/16/2017  . Atypical chest pain 01/30/2017    Past Surgical History:  Procedure Laterality Date  . APPENDECTOMY      Prior to Admission medications   Medication Sig Start Date End Date Taking? Authorizing Provider  apixaban (ELIQUIS) 5 MG TABS tablet 10 mg po bid for 7 days, then 5 mg po bid. 01/31/17   Shaune Pollackhen, Qing, MD  metFORMIN (GLUCOPHAGE) 1000 MG tablet Take 1 tablet (1,000 mg total) by mouth 2 (two) times daily with a meal. 01/31/17   Shaune Pollackhen, Qing, MD  sulfamethoxazole-trimethoprim (BACTRIM DS,SEPTRA DS) 800-160 MG tablet Take 1 tablet by mouth 2 (two) times daily. 03/04/18   Tommi RumpsSummers, Tien Aispuro L, PA-C    Allergies Patient has no known allergies.  Family History  Problem Relation Age of Onset  . Diabetes Mellitus II Paternal Grandmother   . Diabetes Mellitus II  Paternal Grandfather   . Clotting disorder Maternal Grandfather     Social History Social History   Tobacco Use  . Smoking status: Never Smoker  . Smokeless tobacco: Never Used  Substance Use Topics  . Alcohol use: No  . Drug use: No    Review of Systems Constitutional: No fever/chills Cardiovascular: Denies chest pain. Respiratory: Denies shortness of breath. Musculoskeletal: Left leg pain. Skin: Positive for erythema and insect bite. Neurological: Negative for headaches, focal weakness or numbness. ___________________________________________   PHYSICAL EXAM:  VITAL SIGNS: ED Triage Vitals  Enc Vitals Group     BP 03/04/18 1650 (!) 152/92     Pulse Rate 03/04/18 1650 99     Resp 03/04/18 1650 16     Temp 03/04/18 1650 98.1 F (36.7 C)     Temp Source 03/04/18 1650 Oral     SpO2 03/04/18 1650 100 %     Weight 03/04/18 1651 270 lb (122.5 kg)     Height 03/04/18 1651 5\' 5"  (1.651 m)     Head Circumference --      Peak Flow --      Pain Score 03/04/18 1650 8     Pain Loc --      Pain Edu? --      Excl. in GC? --    Constitutional: Alert and oriented. Well appearing and in no acute distress. Eyes:  Conjunctivae are normal.  Head: Atraumatic. Neck: No stridor.   Cardiovascular: Normal rate, regular rhythm. Grossly normal heart sounds.  Good peripheral circulation. Respiratory: Normal respiratory effort.  No retractions. Lungs CTAB. Musculoskeletal: On exam of the left lower leg there is an erythematous area on the lateral aspect that is warm to touch.  There is no drainage from the area however in the center there appears to be an area that is consistent with an insect bite.  No abscess formation is appreciated on palpation.  Patient is able to ambulate without assistance. Neurologic:  Normal speech and language. No gross focal neurologic deficits are appreciated. No gait instability. Skin:  Skin is warm, dry and intact.  Insect bite as noted above. Psychiatric: Mood  and affect are normal. Speech and behavior are normal.  ____________________________________________   LABS (all labs ordered are listed, but only abnormal results are displayed)  Labs Reviewed  GLUCOSE, CAPILLARY - Abnormal; Notable for the following components:      Result Value   Glucose-Capillary 370 (*)    All other components within normal limits  CBG MONITORING, ED    PROCEDURES  Procedure(s) performed: None  Procedures  Critical Care performed: No  ____________________________________________   INITIAL IMPRESSION / ASSESSMENT AND PLAN / ED COURSE  As part of my medical decision making, I reviewed the following data within the electronic MEDICAL RECORD NUMBER Notes from prior ED visits and [place] Controlled Substance Database  Patient was made aware that most likely she has a cellulitis secondary to her insect bite.  Because she does not monitor her glucose levels a fingerstick was done.  She was made aware that her blood sugar was 370.  Patient did not take this serious and she and her friends began laughing.  She was encouraged to call make an appointment with with her PCP and to get a referral to the lifestyle Center get better control of her diabetes.  Complications of uncontrolled diabetes was pointed out to her.  ____________________________________________   FINAL CLINICAL IMPRESSION(S) / ED DIAGNOSES  Final diagnoses:  Cellulitis of lower leg  Poorly controlled diabetes mellitus [facility])     ED Discharge Orders        Ordered    sulfamethoxazole-trimethoprim (BACTRIM DS,SEPTRA DS) 800-160 MG tablet  2 times daily     03/04/18 1732       Note:  This document was prepared using Dragon voice recognition software and may include unintentional dictation errors.    Tommi Rumps, PA-C 03/04/18 1755    Jeanmarie Plant, MD 03/04/18 [phone]

## 2018-10-14 ENCOUNTER — Emergency Department
Admission: EM | Admit: 2018-10-14 | Discharge: 2018-10-14 | Disposition: A | Discharge status: Home / Self Care | Payer: No Typology Code available for payment source | Attending: Emergency Medicine | Admitting: Emergency Medicine

## 2018-10-14 ENCOUNTER — Other Ambulatory Visit: Payer: Self-pay

## 2018-10-14 ENCOUNTER — Emergency Department: Payer: No Typology Code available for payment source

## 2018-10-14 DIAGNOSIS — M542 Cervicalgia: Secondary | ICD-10-CM | POA: Diagnosis not present

## 2018-10-14 DIAGNOSIS — E119 Type 2 diabetes mellitus without complications: Secondary | ICD-10-CM | POA: Insufficient documentation

## 2018-10-14 DIAGNOSIS — S29019A Strain of muscle and tendon of unspecified wall of thorax, initial encounter: Secondary | ICD-10-CM | POA: Diagnosis not present

## 2018-10-14 DIAGNOSIS — S299XXA Unspecified injury of thorax, initial encounter: Secondary | ICD-10-CM | POA: Diagnosis present

## 2018-10-14 DIAGNOSIS — Y998 Other external cause status: Secondary | ICD-10-CM | POA: Diagnosis not present

## 2018-10-14 DIAGNOSIS — Y9389 Activity, other specified: Secondary | ICD-10-CM | POA: Diagnosis not present

## 2018-10-14 DIAGNOSIS — Y92410 Unspecified street and highway as the place of occurrence of the external cause: Secondary | ICD-10-CM | POA: Diagnosis not present

## 2018-10-14 MED ORDER — IBUPROFEN 600 MG PO TABS: 600.0000 mg | ORAL_TABLET | Freq: Three times a day (TID) | ORAL | 0 refills | Status: DC | PRN

## 2018-10-14 MED ORDER — IBUPROFEN 800 MG PO TABS
800.0000 mg | ORAL_TABLET | Freq: Once | ORAL | Status: AC
  Administered 2018-10-14: 800 mg via ORAL
  Filled 2018-10-14: qty 1

## 2018-10-14 MED ORDER — DIAZEPAM 5 MG PO TABS: 5.0000 mg | ORAL_TABLET | Freq: Three times a day (TID) | ORAL | 0 refills | Status: AC | PRN
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
## 2018-10-14 NOTE — ED Notes (Signed)
Patient transported to X-ray 
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
## 2019-12-10 ENCOUNTER — Encounter: Payer: Self-pay | Admitting: Certified Nurse Midwife

## 2019-12-12 ENCOUNTER — Encounter: Payer: Self-pay | Admitting: Certified Nurse Midwife

## 2020-02-03 ENCOUNTER — Emergency Department
Admission: EM | Admit: 2020-02-03 | Discharge: 2020-02-03 | Disposition: A | Discharge status: Home / Self Care | Payer: 59 | Attending: Student | Admitting: Student

## 2020-02-03 ENCOUNTER — Other Ambulatory Visit: Payer: Self-pay

## 2020-02-03 ENCOUNTER — Encounter: Payer: Self-pay | Admitting: *Deleted

## 2020-02-03 DIAGNOSIS — Y9389 Activity, other specified: Secondary | ICD-10-CM | POA: Insufficient documentation

## 2020-02-03 DIAGNOSIS — E119 Type 2 diabetes mellitus without complications: Secondary | ICD-10-CM | POA: Insufficient documentation

## 2020-02-03 DIAGNOSIS — M549 Dorsalgia, unspecified: Secondary | ICD-10-CM | POA: Diagnosis present

## 2020-02-03 DIAGNOSIS — Y92410 Unspecified street and highway as the place of occurrence of the external cause: Secondary | ICD-10-CM | POA: Insufficient documentation

## 2020-02-03 DIAGNOSIS — Y999 Unspecified external cause status: Secondary | ICD-10-CM | POA: Diagnosis not present

## 2020-02-03 DIAGNOSIS — M542 Cervicalgia: Secondary | ICD-10-CM | POA: Insufficient documentation

## 2020-02-03 DIAGNOSIS — Z7901 Long term (current) use of anticoagulants: Secondary | ICD-10-CM | POA: Diagnosis not present

## 2020-02-03 DIAGNOSIS — Z7984 Long term (current) use of oral hypoglycemic drugs: Secondary | ICD-10-CM | POA: Insufficient documentation

## 2020-02-03 MED ORDER — BACLOFEN 5 MG PO TABS: 5.0000 mg | ORAL_TABLET | Freq: Three times a day (TID) | ORAL | 0 refills | Status: AC | PRN
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
## 2021-01-07 IMAGING — CR DG THORACIC SPINE 2V
4 series · 4 of 4 positions shown · non-contrast
Comparison: None.

CLINICAL DATA: 22-year-old female with neck pain

EXAM:
THORACIC SPINE 2 VIEWS

[t-spine ap]
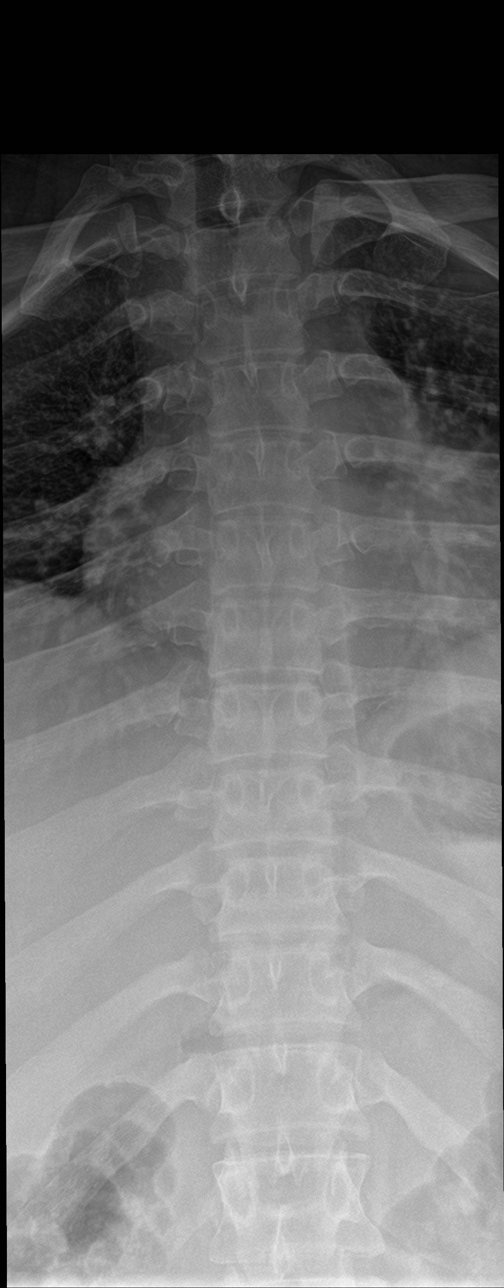

[t-spine lat (1 of 2)]
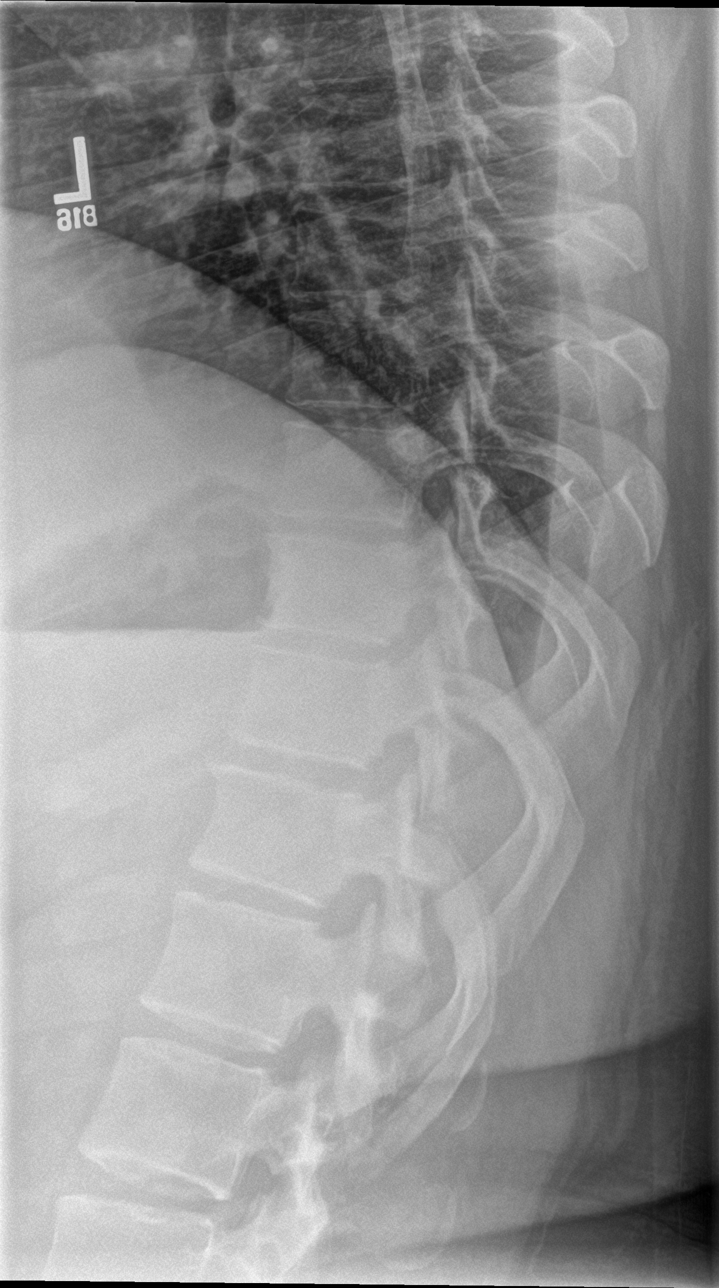

[c-spine swimmers]
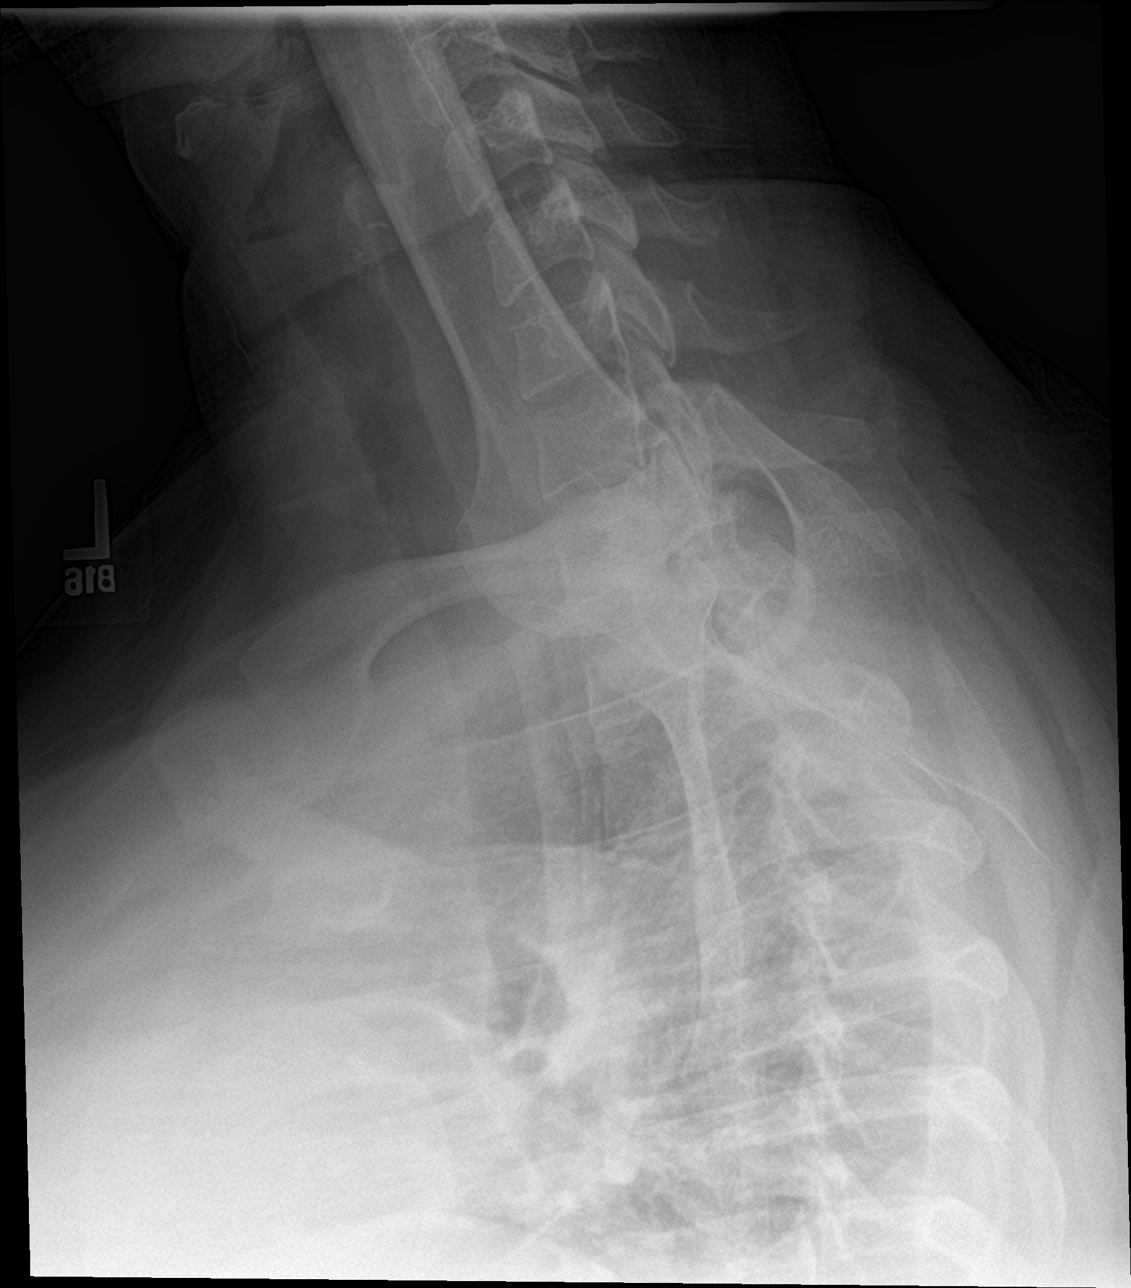

[t-spine lat (2 of 2)]
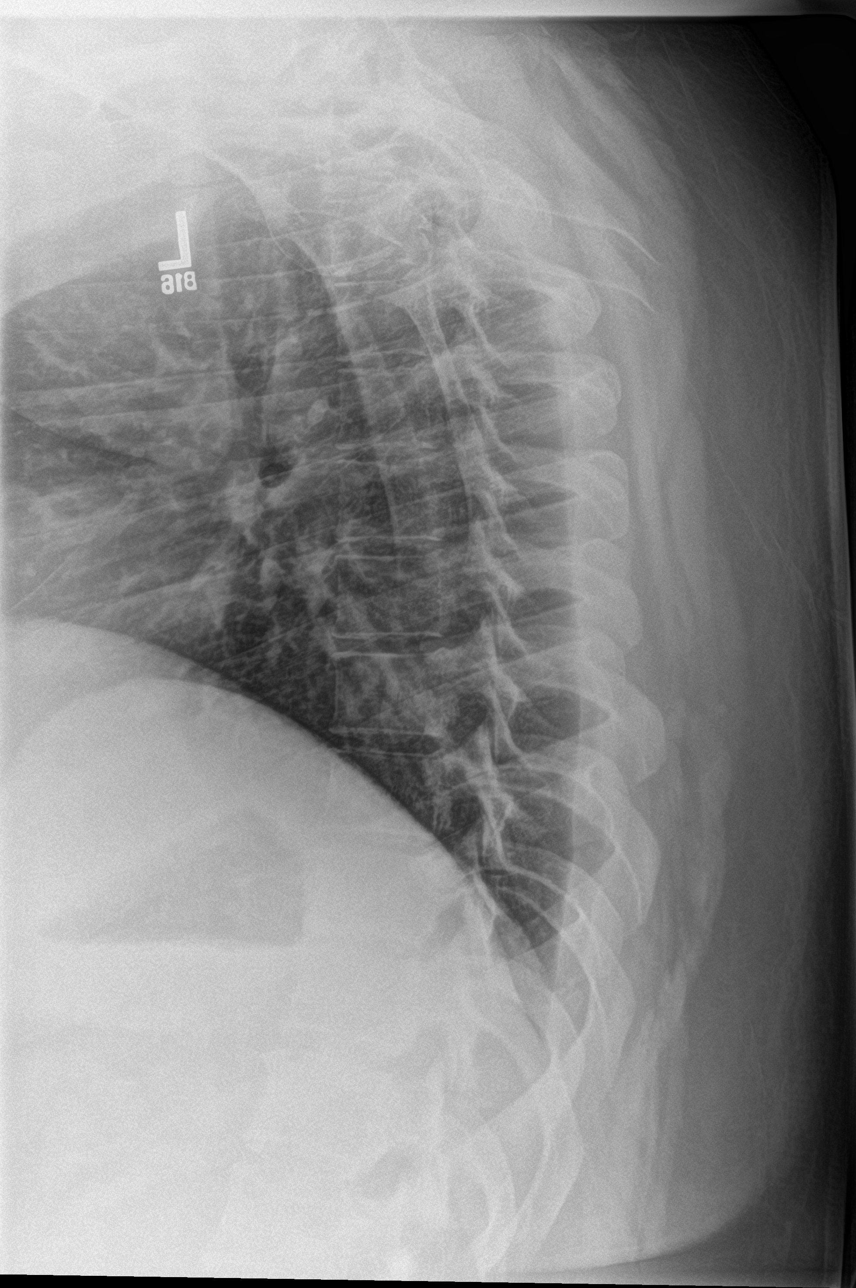

[4 of 4 positions shown; findings below may reference images not displayed]

FINDINGS: Thoracic Spine:

Thoracic vertebral elements maintain normal anatomic alignment, with
no evidence of anterolisthesis, retrolisthesis, or subluxation.

No acute fracture line identified.

Vertebral body heights maintained.

No significant endplate changes or facet disease.

Unremarkable appearance of the visualized thorax.
IMPRESSION: Negative for acute fracture or malalignment of the thoracic spine
